# Patient Record
Sex: Female | Born: 1985 | Race: White | Hispanic: No | Smoking: Never smoker
Health system: Southern US, Community
[De-identification: ages and names within clinical notes are randomized; demographics above are authoritative.]

## PROBLEM LIST (undated history)

## (undated) DIAGNOSIS — Z3490 Encounter for supervision of normal pregnancy, unspecified, unspecified trimester: Principal | ICD-10-CM

## (undated) DIAGNOSIS — Z9089 Acquired absence of other organs: Secondary | ICD-10-CM

## (undated) HISTORY — DX: Encounter for supervision of normal pregnancy, unspecified, unspecified trimester: Z34.90

## (undated) HISTORY — PX: TONSILLECTOMY AND ADENOIDECTOMY: SHX28

---

## 2014-01-23 ENCOUNTER — Telehealth: Payer: Self-pay | Admitting: Family Medicine

## 2014-01-23 NOTE — Telephone Encounter (Signed)
This pt has not been seen since 2008, would like to come back but we are not taking  New patients at this time. She was a pt, went to school, got married moved away and is now [place] in [place].   The daughter Alicia Wheeler And Possibly husband Alicia Wheeler Would also like to join our practice.   Please advise

## 2014-01-23 NOTE — Telephone Encounter (Signed)
Will accept all, thanks

## 2014-01-24 NOTE — Telephone Encounter (Signed)
Called an advised we will take them all on

## 2014-10-30 ENCOUNTER — Ambulatory Visit (INDEPENDENT_AMBULATORY_CARE_PROVIDER_SITE_OTHER): Payer: BC Managed Care – PPO | Admitting: Adult Health

## 2014-10-30 ENCOUNTER — Encounter: Payer: Self-pay | Admitting: Adult Health

## 2014-10-30 VITALS — BP 120/84 | Ht 63.0 in | Wt 152.0 lb

## 2014-10-30 DIAGNOSIS — Z3490 Encounter for supervision of normal pregnancy, unspecified, unspecified trimester: Secondary | ICD-10-CM | POA: Insufficient documentation

## 2014-10-30 DIAGNOSIS — Z3201 Encounter for pregnancy test, result positive: Secondary | ICD-10-CM

## 2014-10-30 HISTORY — DX: Encounter for supervision of normal pregnancy, unspecified, unspecified trimester: Z34.90

## 2014-10-30 LAB — POCT URINE PREGNANCY: Preg Test, Ur: POSITIVE

## 2014-10-30 NOTE — Patient Instructions (Signed)
First Trimester of Pregnancy The first trimester of pregnancy is from week 1 until the end of week 12 (months 1 through 3). A week after a sperm fertilizes an egg, the egg will implant on the wall of the uterus. This embryo will begin to develop into a baby. Genes from you and your partner are forming the baby. The female genes determine whether the baby is a boy or a girl. At 6-8 weeks, the eyes and face are formed, and the heartbeat can be seen on ultrasound. At the end of 12 weeks, all the baby's organs are formed.  Now that you are pregnant, you will want to do everything you can to have a healthy baby. Two of the most important things are to get good prenatal care and to follow your health care provider's instructions. Prenatal care is all the medical care you receive before the baby's birth. This care will help prevent, find, and treat any problems during the pregnancy and childbirth. BODY CHANGES Your body goes through many changes during pregnancy. The changes vary from woman to woman.   You may gain or lose a couple of pounds at first.  You may feel sick to your stomach (nauseous) and throw up (vomit). If the vomiting is uncontrollable, call your health care provider.  You may tire easily.  You may develop headaches that can be relieved by medicines approved by your health care provider.  You may urinate more often. Painful urination may mean you have a bladder infection.  You may develop heartburn as a result of your pregnancy.  You may develop constipation because certain hormones are causing the muscles that push waste through your intestines to slow down.  You may develop hemorrhoids or swollen, bulging veins (varicose veins).  Your breasts may begin to grow larger and become tender. Your nipples may stick out more, and the tissue that surrounds them (areola) may become darker.  Your gums may bleed and may be sensitive to brushing and flossing.  Dark spots or blotches (chloasma,  mask of pregnancy) may develop on your face. This will likely fade after the baby is born.  Your menstrual periods will stop.  You may have a loss of appetite.  You may develop cravings for certain kinds of food.  You may have changes in your emotions from day to day, such as being excited to be pregnant or being concerned that something may go wrong with the pregnancy and baby.  You may have more vivid and strange dreams.  You may have changes in your hair. These can include thickening of your hair, rapid growth, and changes in texture. Some women also have hair loss during or after pregnancy, or hair that feels dry or thin. Your hair will most likely return to normal after your baby is born. WHAT TO EXPECT AT YOUR PRENATAL VISITS During a routine prenatal visit:  You will be weighed to make sure you and the baby are growing normally.  Your blood pressure will be taken.  Your abdomen will be measured to track your baby's growth.  The fetal heartbeat will be listened to starting around week 10 or 12 of your pregnancy.  Test results from any previous visits will be discussed. Your health care provider may ask you:  How you are feeling.  If you are feeling the baby move.  If you have had any abnormal symptoms, such as leaking fluid, bleeding, severe headaches, or abdominal cramping.  If you have any questions. Other tests   that may be performed during your first trimester include:  Blood tests to find your blood type and to check for the presence of any previous infections. They will also be used to check for low iron levels (anemia) and Rh antibodies. Later in the pregnancy, blood tests for diabetes will be done along with other tests if problems develop.  Urine tests to check for infections, diabetes, or protein in the urine.  An ultrasound to confirm the proper growth and development of the baby.  An amniocentesis to check for possible genetic problems.  Fetal screens for  spina bifida and Down syndrome.  You may need other tests to make sure you and the baby are doing well. HOME CARE INSTRUCTIONS  Medicines  Follow your health care provider's instructions regarding medicine use. Specific medicines may be either safe or unsafe to take during pregnancy.  Take your prenatal vitamins as directed.  If you develop constipation, try taking a stool softener if your health care provider approves. Diet  Eat regular, well-balanced meals. Choose a variety of foods, such as meat or vegetable-based protein, fish, milk and low-fat dairy products, vegetables, fruits, and whole grain breads and cereals. Your health care provider will help you determine the amount of weight gain that is right for you.  Avoid raw meat and uncooked cheese. These carry germs that can cause birth defects in the baby.  Eating four or five small meals rather than three large meals a day may help relieve nausea and vomiting. If you start to feel nauseous, eating a few soda crackers can be helpful. Drinking liquids between meals instead of during meals also seems to help nausea and vomiting.  If you develop constipation, eat more high-fiber foods, such as fresh vegetables or fruit and whole grains. Drink enough fluids to keep your urine clear or pale yellow. Activity and Exercise  Exercise only as directed by your health care provider. Exercising will help you:  Control your weight.  Stay in shape.  Be prepared for labor and delivery.  Experiencing pain or cramping in the lower abdomen or low back is a good sign that you should stop exercising. Check with your health care provider before continuing normal exercises.  Try to avoid standing for long periods of time. Move your legs often if you must stand in one place for a long time.  Avoid heavy lifting.  Wear low-heeled shoes, and practice good posture.  You may continue to have sex unless your health care provider directs you  otherwise. Relief of Pain or Discomfort  Wear a good support bra for breast tenderness.   Take warm sitz baths to soothe any pain or discomfort caused by hemorrhoids. Use hemorrhoid cream if your health care provider approves.   Rest with your legs elevated if you have leg cramps or low back pain.  If you develop varicose veins in your legs, wear support hose. Elevate your feet for 15 minutes, 3-4 times a day. Limit salt in your diet. Prenatal Care  Schedule your prenatal visits by the twelfth week of pregnancy. They are usually scheduled monthly at first, then more often in the last 2 months before delivery.  Write down your questions. Take them to your prenatal visits.  Keep all your prenatal visits as directed by your health care provider. Safety  Wear your seat belt at all times when driving.  Make a list of emergency phone numbers, including numbers for family, friends, the hospital, and police and fire departments. General Tips    Ask your health care provider for a referral to a local prenatal education class. Begin classes no later than at the beginning of month 6 of your pregnancy.  Ask for help if you have counseling or nutritional needs during pregnancy. Your health care provider can offer advice or refer you to specialists for help with various needs.  Do not use hot tubs, steam rooms, or saunas.  Do not douche or use tampons or scented sanitary pads.  Do not cross your legs for long periods of time.  Avoid cat litter boxes and soil used by cats. These carry germs that can cause birth defects in the baby and possibly loss of the fetus by miscarriage or stillbirth.  Avoid all smoking, herbs, alcohol, and medicines not prescribed by your health care provider. Chemicals in these affect the formation and growth of the baby.  Schedule a dentist appointment. At home, brush your teeth with a soft toothbrush and be gentle when you floss. SEEK MEDICAL CARE IF:   You have  dizziness.  You have mild pelvic cramps, pelvic pressure, or nagging pain in the abdominal area.  You have persistent nausea, vomiting, or diarrhea.  You have a bad smelling vaginal discharge.  You have pain with urination.  You notice increased swelling in your face, hands, legs, or ankles. SEEK IMMEDIATE MEDICAL CARE IF:   You have a fever.  You are leaking fluid from your vagina.  You have spotting or bleeding from your vagina.  You have severe abdominal cramping or pain.  You have rapid weight gain or loss.  You vomit blood or material that looks like coffee grounds.  You are exposed to [place] measles and have never had them.  You are exposed to fifth disease or chickenpox.  You develop a severe headache.  You have shortness of breath.  You have any kind of trauma, such as from a fall or a car accident. Document Released: 11/25/2001 Document Revised: 04/17/2014 Document Reviewed: 10/11/2013 [facility] Patient Information 2015 [place] [place]. This information is not intended to replace advice given to you by your health care provider. Make sure you discuss any questions you have with your health care provider. Return in 1 week for new OB

## 2014-10-30 NOTE — Progress Notes (Signed)
Subjective:     Patient ID: Alicia CantorAdrienne Wheeler, female   DOB: 07/28/1986, 28 y.o.   MRN: [id number]  HPI [name] is a 28 year old white female in for UPT.  Review of Systems See HPI Reviewed past medical,surgical, social and family history. Reviewed medications and allergies.     Objective:   Physical Exam ,BP 120/84 mmHg  Ht 5\' 3"  (1.6 m)  Wt 152 lb (68.947 kg)  BMI 26.93 kg/m2  LMP 09/08/2014   +UPT, declines dating US, has some nausea, declines meds, she is about 7+3 weeks by LMP with EDD 06/16/15.Medicaid form given.  Assessment:     Pregnant +UPT    Plan:    Continue prenatal vitamins Return in 1 week for New OB, does not want early US Review handout on first trimester and try for pregnancy medicaid

## 2014-11-07 ENCOUNTER — Encounter: Payer: BC Managed Care – PPO | Admitting: Advanced Practice Midwife

## 2014-11-15 ENCOUNTER — Encounter: Payer: BC Managed Care – PPO | Admitting: Advanced Practice Midwife

## 2014-11-22 ENCOUNTER — Other Ambulatory Visit (HOSPITAL_COMMUNITY)
Admission: RE | Admit: 2014-11-22 | Discharge: 2014-11-22 | Disposition: A | Discharge status: Home / Self Care | Payer: BC Managed Care – PPO | Source: Ambulatory Visit | Attending: Obstetrics and Gynecology | Admitting: Obstetrics and Gynecology

## 2014-11-22 ENCOUNTER — Ambulatory Visit (INDEPENDENT_AMBULATORY_CARE_PROVIDER_SITE_OTHER): Payer: BC Managed Care – PPO | Admitting: Advanced Practice Midwife

## 2014-11-22 ENCOUNTER — Other Ambulatory Visit: Payer: Self-pay | Admitting: Advanced Practice Midwife

## 2014-11-22 ENCOUNTER — Ambulatory Visit (INDEPENDENT_AMBULATORY_CARE_PROVIDER_SITE_OTHER): Payer: BC Managed Care – PPO

## 2014-11-22 ENCOUNTER — Encounter: Payer: Self-pay | Admitting: Advanced Practice Midwife

## 2014-11-22 VITALS — BP 120/80 | Wt 150.0 lb

## 2014-11-22 DIAGNOSIS — Z0184 Encounter for antibody response examination: Secondary | ICD-10-CM

## 2014-11-22 DIAGNOSIS — Z331 Pregnant state, incidental: Secondary | ICD-10-CM

## 2014-11-22 DIAGNOSIS — O3680X0 Pregnancy with inconclusive fetal viability, not applicable or unspecified: Secondary | ICD-10-CM

## 2014-11-22 DIAGNOSIS — Z3491 Encounter for supervision of normal pregnancy, unspecified, first trimester: Secondary | ICD-10-CM

## 2014-11-22 DIAGNOSIS — Z3481 Encounter for supervision of other normal pregnancy, first trimester: Secondary | ICD-10-CM

## 2014-11-22 DIAGNOSIS — Z0283 Encounter for blood-alcohol and blood-drug test: Secondary | ICD-10-CM

## 2014-11-22 DIAGNOSIS — Z3490 Encounter for supervision of normal pregnancy, unspecified, unspecified trimester: Secondary | ICD-10-CM

## 2014-11-22 DIAGNOSIS — Z114 Encounter for screening for human immunodeficiency virus [HIV]: Secondary | ICD-10-CM

## 2014-11-22 DIAGNOSIS — Z01419 Encounter for gynecological examination (general) (routine) without abnormal findings: Secondary | ICD-10-CM | POA: Insufficient documentation

## 2014-11-22 DIAGNOSIS — Z1389 Encounter for screening for other disorder: Secondary | ICD-10-CM

## 2014-11-22 DIAGNOSIS — Z124 Encounter for screening for malignant neoplasm of cervix: Secondary | ICD-10-CM

## 2014-11-22 DIAGNOSIS — Z113 Encounter for screening for infections with a predominantly sexual mode of transmission: Secondary | ICD-10-CM

## 2014-11-22 LAB — POCT URINALYSIS DIPSTICK
GLUCOSE UA: NEGATIVE
Ketones, UA: NEGATIVE
Nitrite, UA: NEGATIVE
Protein, UA: NEGATIVE

## 2014-11-22 LAB — CBC
HCT: 38.5 % (ref 36.0–46.0)
Hemoglobin: 13.2 g/dL (ref 12.0–15.0)
MCH: 29.9 pg (ref 26.0–34.0)
MCHC: 34.3 g/dL (ref 30.0–36.0)
MCV: 87.1 fL (ref 78.0–100.0)
MPV: 11.2 fL (ref 9.4–12.4)
PLATELETS: 195 10*3/uL (ref 150–400)
RBC: 4.42 MIL/uL (ref 3.87–5.11)
RDW: 13.2 % (ref 11.5–15.5)
WBC: 6 10*3/uL (ref 4.0–10.5)

## 2014-11-22 LAB — OB RESULTS CONSOLE GBS: GBS: POSITIVE

## 2014-11-22 NOTE — Progress Notes (Signed)
  Subjective:    Alicia Wheeler is a G2P1001 2388w5d being seen today for her first obstetrical visit.  Her obstetrical history is significant for post partum hemorrhage.  Pregnancy history fully reviewed.  Patient reports no complaints.   Filed Vitals:   11/22/14 1101  BP: 120/80  Weight: 150 lb (68.04 kg)    HISTORY: OB History  Gravida Para Term Preterm AB SAB TAB Ectopic Multiple Living  2 1 1       1     # Outcome Date GA Lbr Len/2nd Weight Sex Delivery Anes PTL Lv  2 Current           1 Term 01/05/13 6666w0d  8 lb 13 oz (3.997 kg) F Vag-Spont EPI N Y     Complications: Other Excessive Bleeding     Past Medical History  Diagnosis Date  . Pregnant 10/30/2014   Past Surgical History  Procedure Laterality Date  . Tonsillectomy and adenoidectomy     Family History  Problem Relation Age of Onset  . Kidney failure Paternal Grandfather   . Kidney failure Paternal Grandmother   . Cancer Maternal Grandmother     stomach  . Cancer Maternal Grandfather     bone  . Hyperlipidemia Father   . Hyperlipidemia Mother      Exam       Pelvic Exam:    Perineum: Normal Perineum   Vulva: Normal. Pt concerned with excess skin from previous laceration:  Will remove if has another laceration   Vagina:  normal mucosa, normal discharge, no palpable nodules   Uterus Normal, Gravid, FH:  ~10     Cervix: normal   Adnexa: Not palpable   Urinary:  urethral meatus normal    System: Breast:  normal appearance, no masses or tenderness   Skin: normal coloration and turgor, no rashes    Neurologic: oriented, normal, normal mood   Extremities: normal strength, tone, and muscle mass   HEENT PERRLA   Mouth/Teeth mucous membranes moist, normal dentition   Neck supple and no masses   Cardiovascular: regular rate and rhythm   Respiratory:  appears well, vitals normal, no respiratory distress, acyanotic   Abdomen: soft, non-tender;  FHR: 148          Assessment:    Pregnancy:  G2P1001 Patient Active Problem List   Diagnosis Date Noted  . Pregnant 10/30/2014        Plan:     Initial labs drawn. Continue prenatal vitamins  Problem list reviewed and updated  Reviewed n/v relief measures and warning s/s to report  Reviewed recommended weight gain based on pre-gravid BMI  Encouraged well-balanced diet Genetic Screening discussed Integrated Screen: declined.  Ultrasound discussed; fetal survey: requested.  Follow up in 4 weeks for OBV.  CRESENZO-DISHMAN,Jeremaine Maraj 11/22/2014

## 2014-11-22 NOTE — Progress Notes (Signed)
U/S(10+5wks)-single IUP with +FCA noted, FHR-164 bpm, CRL c/w LMP dates, cx appears closed (3.2cm), bilateral adnexa appears WNL with C.L. Noted on Rt, posterior Gr 0 placenta

## 2014-11-23 LAB — URINALYSIS, ROUTINE W REFLEX MICROSCOPIC
Bilirubin Urine: NEGATIVE
GLUCOSE, UA: NEGATIVE mg/dL
Hgb urine dipstick: NEGATIVE
Ketones, ur: NEGATIVE mg/dL
Nitrite: NEGATIVE
Protein, ur: NEGATIVE mg/dL
Specific Gravity, Urine: 1.026 (ref 1.005–1.030)
Urobilinogen, UA: 0.2 mg/dL (ref 0.0–1.0)
pH: 7 (ref 5.0–8.0)

## 2014-11-23 LAB — ABO AND RH: RH TYPE: POSITIVE

## 2014-11-23 LAB — ANTIBODY SCREEN: ANTIBODY SCREEN: NEGATIVE

## 2014-11-23 LAB — DRUG SCREEN, URINE, NO CONFIRMATION
AMPHETAMINE SCRN UR: NEGATIVE
BENZODIAZEPINES.: NEGATIVE
Barbiturate Quant, Ur: NEGATIVE
COCAINE METABOLITES: NEGATIVE
CREATININE, U: 306.3 mg/dL
METHADONE: NEGATIVE
Marijuana Metabolite: NEGATIVE
Opiate Screen, Urine: NEGATIVE
Phencyclidine (PCP): NEGATIVE
Propoxyphene: NEGATIVE

## 2014-11-23 LAB — URINALYSIS, MICROSCOPIC ONLY
CASTS: NONE SEEN
Crystals: NONE SEEN
Squamous Epithelial / LPF: NONE SEEN

## 2014-11-23 LAB — HEPATITIS B SURFACE ANTIGEN: HEP B S AG: NEGATIVE

## 2014-11-23 LAB — RUBELLA SCREEN: Rubella: 2.59 Index — ABNORMAL HIGH (ref <0.90)

## 2014-11-23 LAB — HIV ANTIBODY (ROUTINE TESTING W REFLEX): HIV 1&2 Ab, 4th Generation: NONREACTIVE

## 2014-11-23 LAB — CYTOLOGY - PAP

## 2014-11-23 LAB — GC/CHLAMYDIA PROBE AMP
CT Probe RNA: NEGATIVE
GC PROBE AMP APTIMA: NEGATIVE

## 2014-11-23 LAB — RPR

## 2014-11-23 LAB — VARICELLA ZOSTER ANTIBODY, IGG: Varicella IgG: 355.9 Index — ABNORMAL HIGH (ref <135.00)

## 2014-11-24 LAB — OXYCODONE SCREEN, UA, RFLX CONFIRM: Oxycodone Screen, Ur: NEGATIVE ng/mL

## 2014-11-24 LAB — URINE CULTURE: Colony Count: 40000

## 2014-12-15 NOTE — L&D Delivery Note (Cosign Needed)
Delivery Note Pt pushed well and at 12:41 PM a viable female was delivered via Vaginal, Spontaneous Delivery (Presentation: Left Occiput Anterior).  APGAR: 8, 8; weight 8 lb 3 oz (3714 g).  Infant dried and lifted to pt's abd. Cord clamped and cut by FOB. Hospital cord blood sample collected.  Placenta status: Intact, Spontaneous.  Cord: 3 vessels with the following complications: None.    Anesthesia: Epidural  Episiotomy: None Lacerations: None Est. Blood Loss (mL): 459  Mom to postpartum.  Baby to Couplet care / Skin to Skin.  Pt given 1mg  of cytotec PO as prophylaxis after placenta delivered due to hx of PPH.   Deverick Pruss 06/14/2015, 1:22 PM

## 2014-12-20 ENCOUNTER — Ambulatory Visit (INDEPENDENT_AMBULATORY_CARE_PROVIDER_SITE_OTHER): Payer: Medicaid Other | Admitting: Advanced Practice Midwife

## 2014-12-20 VITALS — BP 120/82 | Wt 151.0 lb

## 2014-12-20 DIAGNOSIS — Z1389 Encounter for screening for other disorder: Secondary | ICD-10-CM

## 2014-12-20 DIAGNOSIS — Z3492 Encounter for supervision of normal pregnancy, unspecified, second trimester: Secondary | ICD-10-CM

## 2014-12-20 DIAGNOSIS — Z363 Encounter for antenatal screening for malformations: Secondary | ICD-10-CM

## 2014-12-20 DIAGNOSIS — Z331 Pregnant state, incidental: Secondary | ICD-10-CM

## 2014-12-20 LAB — POCT URINALYSIS DIPSTICK
Glucose, UA: NEGATIVE
Ketones, UA: NEGATIVE
Nitrite, UA: NEGATIVE
Protein, UA: NEGATIVE
RBC UA: NEGATIVE

## 2014-12-20 NOTE — Patient Instructions (Signed)

## 2014-12-20 NOTE — Progress Notes (Signed)
G2P1001 5736w5d Estimated Date of Delivery: 06/15/15  Blood pressure 120/82, weight 151 lb (68.493 kg), last menstrual period 09/08/2014.   BP weight and urine results all reviewed and noted.  Please refer to the obstetrical flow sheet for the fundal height and fetal heart rate documentation:  Patient denies any bleeding and no rupture of membranes symptoms or regular contractions. Patient is without complaints other than LBP when she bends over All questions were answered.  Plan:  Continued routine obstetrical care, back exercise handout given  Follow up in 4 weeks for OB appointment, anatomy scan

## 2015-01-22 ENCOUNTER — Ambulatory Visit (INDEPENDENT_AMBULATORY_CARE_PROVIDER_SITE_OTHER): Payer: Medicaid Other

## 2015-01-22 ENCOUNTER — Ambulatory Visit (INDEPENDENT_AMBULATORY_CARE_PROVIDER_SITE_OTHER): Payer: Medicaid Other | Admitting: Women's Health

## 2015-01-22 VITALS — BP 102/78 | Wt 152.0 lb

## 2015-01-22 DIAGNOSIS — Z363 Encounter for antenatal screening for malformations: Secondary | ICD-10-CM

## 2015-01-22 DIAGNOSIS — Z3492 Encounter for supervision of normal pregnancy, unspecified, second trimester: Secondary | ICD-10-CM

## 2015-01-22 DIAGNOSIS — R8271 Bacteriuria: Secondary | ICD-10-CM

## 2015-01-22 DIAGNOSIS — Z1389 Encounter for screening for other disorder: Secondary | ICD-10-CM

## 2015-01-22 DIAGNOSIS — Z331 Pregnant state, incidental: Secondary | ICD-10-CM

## 2015-01-22 DIAGNOSIS — Z23 Encounter for immunization: Secondary | ICD-10-CM

## 2015-01-22 DIAGNOSIS — Z36 Encounter for antenatal screening of mother: Secondary | ICD-10-CM

## 2015-01-22 DIAGNOSIS — L292 Pruritus vulvae: Secondary | ICD-10-CM

## 2015-01-22 LAB — POCT URINALYSIS DIPSTICK
Blood, UA: NEGATIVE
GLUCOSE UA: NEGATIVE
Ketones, UA: NEGATIVE
Leukocytes, UA: NEGATIVE
Nitrite, UA: NEGATIVE

## 2015-01-22 LAB — POCT WET PREP (WET MOUNT): Clue Cells Wet Prep Whiff POC: NEGATIVE

## 2015-01-22 MED ORDER — INFLUENZA VAC SPLIT QUAD 0.5 ML IM SUSY
0.5000 mL | PREFILLED_SYRINGE | Freq: Once | INTRAMUSCULAR | Status: AC
  Administered 2015-01-22: 0.5 mL via INTRAMUSCULAR

## 2015-01-22 MED ORDER — AMPICILLIN 500 MG PO CAPS: 500.0000 mg | ORAL_CAPSULE | Freq: Four times a day (QID) | ORAL | Status: DC

## 2015-01-22 NOTE — Progress Notes (Signed)
U/S(19+wks)-single active fetus, meas c/w dates, fluid WNL, posterior Gr 0 placenta, cx appears closed (3.5cm), FHR-151 bpm, no major abnl noted, female fetus

## 2015-01-22 NOTE — Patient Instructions (Signed)
Second Trimester of Pregnancy The second trimester is from week 13 through week 28, months 4 through 6. The second trimester is often a time when you feel your best. Your body has also adjusted to being pregnant, and you begin to feel better physically. Usually, morning sickness has lessened or quit completely, you may have more energy, and you may have an increase in appetite. The second trimester is also a time when the fetus is growing rapidly. At the end of the sixth month, the fetus is about 9 inches long and weighs about 1 pounds. You will likely begin to feel the baby move (quickening) between 18 and 20 weeks of the pregnancy. BODY CHANGES Your body goes through many changes during pregnancy. The changes vary from woman to woman.   Your weight will continue to increase. You will notice your lower abdomen bulging out.  You may begin to get stretch marks on your hips, abdomen, and breasts.  You may develop headaches that can be relieved by medicines approved by your health care provider.  You may urinate more often because the fetus is pressing on your bladder.  You may develop or continue to have heartburn as a result of your pregnancy.  You may develop constipation because certain hormones are causing the muscles that push waste through your intestines to slow down.  You may develop hemorrhoids or swollen, bulging veins (varicose veins).  You may have back pain because of the weight gain and pregnancy hormones relaxing your joints between the bones in your pelvis and as a result of a shift in weight and the muscles that support your balance.  Your breasts will continue to grow and be tender.  Your gums may bleed and may be sensitive to brushing and flossing.  Dark spots or blotches (chloasma, mask of pregnancy) may develop on your face. This will likely fade after the baby is born.  A dark line from your belly button to the pubic area (linea nigra) may appear. This will likely fade  after the baby is born.  You may have changes in your hair. These can include thickening of your hair, rapid growth, and changes in texture. Some women also have hair loss during or after pregnancy, or hair that feels dry or thin. Your hair will most likely return to normal after your baby is born. WHAT TO EXPECT AT YOUR PRENATAL VISITS During a routine prenatal visit:  You will be weighed to make sure you and the fetus are growing normally.  Your blood pressure will be taken.  Your abdomen will be measured to track your baby's growth.  The fetal heartbeat will be listened to.  Any test results from the previous visit will be discussed. Your health care provider may ask you:  How you are feeling.  If you are feeling the baby move.  If you have had any abnormal symptoms, such as leaking fluid, bleeding, severe headaches, or abdominal cramping.  If you have any questions. Other tests that may be performed during your second trimester include:  Blood tests that check for:  Low iron levels (anemia).  Gestational diabetes (between 24 and 28 weeks).  Rh antibodies.  Urine tests to check for infections, diabetes, or protein in the urine.  An ultrasound to confirm the proper growth and development of the baby.  An amniocentesis to check for possible genetic problems.  Fetal screens for spina bifida and Down syndrome. HOME CARE INSTRUCTIONS   Avoid all smoking, herbs, alcohol, and unprescribed   drugs. These chemicals affect the formation and growth of the baby.  Follow your health care provider's instructions regarding medicine use. There are medicines that are either safe or unsafe to take during pregnancy.  Exercise only as directed by your health care provider. Experiencing uterine cramps is a good sign to stop exercising.  Continue to eat regular, healthy meals.  Wear a good support bra for breast tenderness.  Do not use hot tubs, steam rooms, or saunas.  Wear your  seat belt at all times when driving.  Avoid raw meat, uncooked cheese, cat litter boxes, and soil used by cats. These carry germs that can cause birth defects in the baby.  Take your prenatal vitamins.  Try taking a stool softener (if your health care provider approves) if you develop constipation. Eat more high-fiber foods, such as fresh vegetables or fruit and whole grains. Drink plenty of fluids to keep your urine clear or pale yellow.  Take warm sitz baths to soothe any pain or discomfort caused by hemorrhoids. Use hemorrhoid cream if your health care provider approves.  If you develop varicose veins, wear support hose. Elevate your feet for 15 minutes, 3-4 times a day. Limit salt in your diet.  Avoid heavy lifting, wear low heel shoes, and practice good posture.  Rest with your legs elevated if you have leg cramps or low back pain.  Visit your dentist if you have not gone yet during your pregnancy. Use a soft toothbrush to brush your teeth and be gentle when you floss.  A sexual relationship may be continued unless your health care provider directs you otherwise.  Continue to go to all your prenatal visits as directed by your health care provider. SEEK MEDICAL CARE IF:   You have dizziness.  You have mild pelvic cramps, pelvic pressure, or nagging pain in the abdominal area.  You have persistent nausea, vomiting, or diarrhea.  You have a bad smelling vaginal discharge.  You have pain with urination. SEEK IMMEDIATE MEDICAL CARE IF:   You have a fever.  You are leaking fluid from your vagina.  You have spotting or bleeding from your vagina.  You have severe abdominal cramping or pain.  You have rapid weight gain or loss.  You have shortness of breath with chest pain.  You notice sudden or extreme swelling of your face, hands, ankles, feet, or legs.  You have not felt your baby move in over an hour.  You have severe headaches that do not go away with  medicine.  You have vision changes. Document Released: 11/25/2001 Document Revised: 12/06/2013 Document Reviewed: 02/01/2013 Exit[employer] Patient Information 2015 [employer]. This information is not intended to replace advice given to you by your health care provider. Make sure you discuss any questions you have with your health care provider.  

## 2015-01-22 NOTE — Progress Notes (Signed)
Low-risk OB appointment G2P1001 4123w3d Estimated Date of Delivery: 06/15/15 BP 102/78 mmHg  Wt 152 lb (68.947 kg)  LMP 09/08/2014 (Exact Date)  BP, weight, and urine reviewed.  Refer to obstetrical flow sheet for FH & FHR.  Reports good fm.  Denies regular uc's, lof, vb, or uti s/s. Vulvar itching for few weeks, no abnormal d/c or odor. Has tried hydrocortisone cream, cold compresses are what really help.  Spec exam: cx visually closed, scant creamy white nonodorous d/c, wet prep: few wbc's, no yeast, otherwise neg Continue hydrocortisone cream and cold compresses, let us know if anything changes.  Reviewed today's normal anatomy u/s, ptl s/s, fm. Had GBS bacteruria 40,000 colonies on urine cx-discussed w/ L[facility]- will treat w/ ampicillin qid x7d now, and treat in labor.  Plan:  Continue routine obstetrical care  F/U in 4wks for OB appointment  Flu shot today

## 2015-01-22 NOTE — Addendum Note (Signed)
Addended by: Criss AlvinePULLIAM, Abeni Finchum G on: 01/22/2015 10:48 AM   Modules accepted: Orders

## 2015-02-19 ENCOUNTER — Encounter: Payer: Self-pay | Admitting: Women's Health

## 2015-02-19 ENCOUNTER — Ambulatory Visit (INDEPENDENT_AMBULATORY_CARE_PROVIDER_SITE_OTHER): Payer: Medicaid Other | Admitting: Women's Health

## 2015-02-19 VITALS — BP 104/68 | HR 68 | Wt 155.0 lb

## 2015-02-19 DIAGNOSIS — Z1389 Encounter for screening for other disorder: Secondary | ICD-10-CM

## 2015-02-19 DIAGNOSIS — Z331 Pregnant state, incidental: Secondary | ICD-10-CM

## 2015-02-19 DIAGNOSIS — Z3492 Encounter for supervision of normal pregnancy, unspecified, second trimester: Secondary | ICD-10-CM

## 2015-02-19 LAB — POCT URINALYSIS DIPSTICK
Blood, UA: NEGATIVE
Glucose, UA: NEGATIVE
KETONES UA: NEGATIVE
Nitrite, UA: NEGATIVE
PROTEIN UA: NEGATIVE

## 2015-02-19 NOTE — Progress Notes (Signed)
Low-risk OB appointment G2P1001 10617w3d Estimated Date of Delivery: 06/15/15 BP 104/68 mmHg  Pulse 68  Wt 155 lb (70.308 kg)  LMP 09/08/2014 (Exact Date)  BP, weight, and urine reviewed.  Refer to obstetrical flow sheet for FH & FHR.  Reports good fm.  Denies regular uc's, lof, vb, or uti s/s. No complaints. Reviewed ptl s/s, fm. Plan:  Continue routine obstetrical care  F/U in 4wks for OB appointment and pn2

## 2015-02-19 NOTE — Patient Instructions (Signed)
You will have your sugar test next visit.  Please do not eat or drink anything after midnight the night before you come, not even water.  You will be here for at least two hours.     Call the office ([phone]) or go to [facility] if:  You begin to have strong, frequent contractions  Your water breaks.  Sometimes it is a big gush of fluid, sometimes it is just a trickle that keeps getting your panties wet or running down your legs  You have vaginal bleeding.  It is normal to have a small amount of spotting if your cervix was checked.   You don't feel your baby moving like normal.  If you don't, get you something to eat and drink and lay down and focus on feeling your baby move.   If your baby is still not moving like normal, you should call the office or go to [facility].  Second Trimester of Pregnancy The second trimester is from week 13 through week 28, months 4 through 6. The second trimester is often a time when you feel your best. Your body has also adjusted to being pregnant, and you begin to feel better physically. Usually, morning sickness has lessened or quit completely, you may have more energy, and you may have an increase in appetite. The second trimester is also a time when the fetus is growing rapidly. At the end of the sixth month, the fetus is about 9 inches long and weighs about 1 pounds. You will likely begin to feel the baby move (quickening) between 18 and 20 weeks of the pregnancy. BODY CHANGES Your body goes through many changes during pregnancy. The changes vary from woman to woman.   Your weight will continue to increase. You will notice your lower abdomen bulging out.  You may begin to get stretch marks on your hips, abdomen, and breasts.  You may develop headaches that can be relieved by medicines approved by your health care provider.  You may urinate more often because the fetus is pressing on your bladder.  You may develop or continue to have  heartburn as a result of your pregnancy.  You may develop constipation because certain hormones are causing the muscles that push waste through your intestines to slow down.  You may develop hemorrhoids or swollen, bulging veins (varicose veins).  You may have back pain because of the weight gain and pregnancy hormones relaxing your joints between the bones in your pelvis and as a result of a shift in weight and the muscles that support your balance.  Your breasts will continue to grow and be tender.  Your gums may bleed and may be sensitive to brushing and flossing.  Dark spots or blotches (chloasma, mask of pregnancy) may develop on your face. This will likely fade after the baby is born.  A dark line from your belly button to the pubic area (linea nigra) may appear. This will likely fade after the baby is born.  You may have changes in your hair. These can include thickening of your hair, rapid growth, and changes in texture. Some women also have hair loss during or after pregnancy, or hair that feels dry or thin. Your hair will most likely return to normal after your baby is born. WHAT TO EXPECT AT YOUR PRENATAL VISITS During a routine prenatal visit:  You will be weighed to make sure you and the fetus are growing normally.  Your blood pressure will be taken.    Your abdomen will be measured to track your baby's growth.  The fetal heartbeat will be listened to.  Any test results from the previous visit will be discussed. Your health care provider may ask you:  How you are feeling.  If you are feeling the baby move.  If you have had any abnormal symptoms, such as leaking fluid, bleeding, severe headaches, or abdominal cramping.  If you have any questions. Other tests that may be performed during your second trimester include:  Blood tests that check for:  Low iron levels (anemia).  Gestational diabetes (between 24 and 28 weeks).  Rh antibodies.  Urine tests to check  for infections, diabetes, or protein in the urine.  An ultrasound to confirm the proper growth and development of the baby.  An amniocentesis to check for possible genetic problems.  Fetal screens for spina bifida and Down syndrome. HOME CARE INSTRUCTIONS   Avoid all smoking, herbs, alcohol, and unprescribed drugs. These chemicals affect the formation and growth of the baby.  Follow your health care provider's instructions regarding medicine use. There are medicines that are either safe or unsafe to take during pregnancy.  Exercise only as directed by your health care provider. Experiencing uterine cramps is a good sign to stop exercising.  Continue to eat regular, healthy meals.  Wear a good support bra for breast tenderness.  Do not use hot tubs, steam rooms, or saunas.  Wear your seat belt at all times when driving.  Avoid raw meat, uncooked cheese, cat litter boxes, and soil used by cats. These carry germs that can cause birth defects in the baby.  Take your prenatal vitamins.  Try taking a stool softener (if your health care provider approves) if you develop constipation. Eat more high-fiber foods, such as fresh vegetables or fruit and whole grains. Drink plenty of fluids to keep your urine clear or pale yellow.  Take warm sitz baths to soothe any pain or discomfort caused by hemorrhoids. Use hemorrhoid cream if your health care provider approves.  If you develop varicose veins, wear support hose. Elevate your feet for 15 minutes, 3-4 times a day. Limit salt in your diet.  Avoid heavy lifting, wear low heel shoes, and practice good posture.  Rest with your legs elevated if you have leg cramps or low back pain.  Visit your dentist if you have not gone yet during your pregnancy. Use a soft toothbrush to brush your teeth and be gentle when you floss.  A sexual relationship may be continued unless your health care provider directs you otherwise.  Continue to go to all your  prenatal visits as directed by your health care provider. SEEK MEDICAL CARE IF:   You have dizziness.  You have mild pelvic cramps, pelvic pressure, or nagging pain in the abdominal area.  You have persistent nausea, vomiting, or diarrhea.  You have a bad smelling vaginal discharge.  You have pain with urination. SEEK IMMEDIATE MEDICAL CARE IF:   You have a fever.  You are leaking fluid from your vagina.  You have spotting or bleeding from your vagina.  You have severe abdominal cramping or pain.  You have rapid weight gain or loss.  You have shortness of breath with chest pain.  You notice sudden or extreme swelling of your face, hands, ankles, feet, or legs.  You have not felt your baby move in over an hour.  You have severe headaches that do not go away with medicine.  You have vision changes.   Document Released: 11/25/2001 Document Revised: 12/06/2013 Document Reviewed: 02/01/2013 [employer] Patient Information 2015 [employer]. This information is not intended to replace advice given to you by your health care provider. Make sure you discuss any questions you have with your health care provider.     

## 2015-03-01 ENCOUNTER — Other Ambulatory Visit: Payer: Self-pay | Admitting: Women's Health

## 2015-03-19 ENCOUNTER — Other Ambulatory Visit: Payer: Medicaid Other

## 2015-03-19 ENCOUNTER — Encounter: Payer: Self-pay | Admitting: Women's Health

## 2015-03-19 ENCOUNTER — Ambulatory Visit (INDEPENDENT_AMBULATORY_CARE_PROVIDER_SITE_OTHER): Payer: Medicaid Other | Admitting: Women's Health

## 2015-03-19 VITALS — BP 102/64 | HR 76 | Wt 162.0 lb

## 2015-03-19 DIAGNOSIS — Z0184 Encounter for antibody response examination: Secondary | ICD-10-CM

## 2015-03-19 DIAGNOSIS — Z331 Pregnant state, incidental: Secondary | ICD-10-CM

## 2015-03-19 DIAGNOSIS — Z113 Encounter for screening for infections with a predominantly sexual mode of transmission: Secondary | ICD-10-CM

## 2015-03-19 DIAGNOSIS — Z114 Encounter for screening for human immunodeficiency virus [HIV]: Secondary | ICD-10-CM

## 2015-03-19 DIAGNOSIS — Z3483 Encounter for supervision of other normal pregnancy, third trimester: Secondary | ICD-10-CM

## 2015-03-19 DIAGNOSIS — Z3492 Encounter for supervision of normal pregnancy, unspecified, second trimester: Secondary | ICD-10-CM

## 2015-03-19 DIAGNOSIS — Z1389 Encounter for screening for other disorder: Secondary | ICD-10-CM

## 2015-03-19 DIAGNOSIS — Z131 Encounter for screening for diabetes mellitus: Secondary | ICD-10-CM

## 2015-03-19 LAB — POCT URINALYSIS DIPSTICK
Blood, UA: NEGATIVE
GLUCOSE UA: NEGATIVE
KETONES UA: NEGATIVE
NITRITE UA: NEGATIVE

## 2015-03-19 NOTE — Patient Instructions (Signed)

## 2015-03-19 NOTE — Progress Notes (Signed)
Low-risk OB appointment G2P1001 3741w3d Estimated Date of Delivery: 06/15/15 BP 102/64 mmHg  Pulse 76  Wt 162 lb (73.483 kg)  LMP 09/08/2014 (Exact Date)  BP, weight, and urine reviewed.  Refer to obstetrical flow sheet for FH & FHR.  Reports good fm.  Denies regular uc's, lof, vb, or uti s/s. No complaints. Reviewed ptl s/s, fkc. Recommended Tdap at HD/PCP per CDC guidelines.  Plan:  Continue routine obstetrical care  F/U in 3wks for OB appointment  PN2 today

## 2015-03-20 LAB — CBC
HCT: 36.8 % (ref 34.0–46.6)
Hemoglobin: 12.2 g/dL (ref 11.1–15.9)
MCH: 30.8 pg (ref 26.6–33.0)
MCHC: 33.2 g/dL (ref 31.5–35.7)
MCV: 93 fL (ref 79–97)
Platelets: 173 10*3/uL (ref 150–379)
RBC: 3.96 x10E6/uL (ref 3.77–5.28)
RDW: 14.1 % (ref 12.3–15.4)
WBC: 6.6 10*3/uL (ref 3.4–10.8)

## 2015-03-20 LAB — GLUCOSE TOLERANCE, 2 HOURS W/ 1HR
GLUCOSE, 2 HOUR: 113 mg/dL (ref 65–152)
GLUCOSE, FASTING: 77 mg/dL (ref 65–91)
Glucose, 1 hour: 151 mg/dL (ref 65–179)

## 2015-03-20 LAB — HSV 2 ANTIBODY, IGG: HSV 2 Glycoprotein G Ab, IgG: <0.91 index (ref 0.00–0.90)

## 2015-03-20 LAB — AB SCR+ANTIBODY ID

## 2015-03-20 LAB — HIV ANTIBODY (ROUTINE TESTING W REFLEX): HIV Screen 4th Generation wRfx: NONREACTIVE

## 2015-03-20 LAB — RPR: RPR Ser Ql: NONREACTIVE

## 2015-03-20 LAB — ANTIBODY SCREEN

## 2015-04-16 ENCOUNTER — Ambulatory Visit (INDEPENDENT_AMBULATORY_CARE_PROVIDER_SITE_OTHER): Payer: Medicaid Other | Admitting: Women's Health

## 2015-04-16 VITALS — BP 108/60 | HR 88 | Wt 166.0 lb

## 2015-04-16 DIAGNOSIS — Z331 Pregnant state, incidental: Secondary | ICD-10-CM

## 2015-04-16 DIAGNOSIS — Z1389 Encounter for screening for other disorder: Secondary | ICD-10-CM

## 2015-04-16 DIAGNOSIS — Z3493 Encounter for supervision of normal pregnancy, unspecified, third trimester: Secondary | ICD-10-CM

## 2015-04-16 LAB — POCT URINALYSIS DIPSTICK
Glucose, UA: NEGATIVE
Ketones, UA: NEGATIVE
LEUKOCYTES UA: NEGATIVE
Nitrite, UA: NEGATIVE
PROTEIN UA: NEGATIVE
RBC UA: NEGATIVE

## 2015-04-16 NOTE — Progress Notes (Signed)
Low-risk OB appointment G2P1001 4063w3d Estimated Date of Delivery: 06/15/15 BP 108/60 mmHg  Pulse 88  Wt 166 lb (75.297 kg)  LMP 09/08/2014 (Exact Date)  BP, weight, and urine reviewed.  Refer to obstetrical flow sheet for FH & FHR.  Reports good fm.  Denies regular uc's, lof, vb, or uti s/s. No complaints. Wants paragard- does not want hormones- understands possibility for heavy periods Reviewed ptl s/s, fkc, pn2 results. Plan:  Continue routine obstetrical care  F/U in 2wks for OB appointment

## 2015-04-16 NOTE — Patient Instructions (Signed)
Call the office [phone]) [facility]:  You begin to have strong, frequent contractions  Your water breaks.  Sometimes it is a big gush of fluid, sometimes it is just a trickle that keeps getting your panties wet or running down your legs  You have vaginal bleeding.  It is normal to have a small amount of spotting if your cervix was checked.   You don't feel your baby moving like normal.  If you don't, get you something to eat and drink and lay down and focus on feeling your baby move.  You should feel at least 10 movements in 2 hours.  If you don't, you should call the office or [facility]   Intrauterine Device Information An intrauterine device (IUD) is inserted into your uterus to prevent pregnancy. There are two types of IUDs available:  5. Copper IUD--This type of IUD is wrapped in copper wire and is placed inside the uterus. Copper makes the uterus and fallopian tubes produce a fluid that kills sperm. The copper IUD can stay in place for 10 years. 6. Hormone IUD--This type of IUD contains the hormone progestin (synthetic progesterone). The hormone thickens the cervical mucus and prevents sperm from entering the uterus. It also thins the uterine lining to prevent implantation of a fertilized egg. The hormone can weaken or kill the sperm that get into the uterus. One type of hormone IUD can stay in place for 5 years, and another type can stay in place for 3 years. Your health care provider will make sure you are a good candidate for a contraceptive IUD. Discuss with your health care provider the possible side effects.  ADVANTAGES OF AN INTRAUTERINE DEVICE  IUDs are highly effective, reversible, long acting, and low maintenance.   There are no estrogen-related side effects.   An IUD can be used when breastfeeding.   IUDs are not associated with weight gain.   The copper IUD works immediately after insertion.   The hormone IUD works right away if  inserted within 7 days of your period starting. You will need to use a backup method of birth control for 7 days if the hormone IUD is inserted at any other time in your cycle.  The copper IUD does not interfere with your female hormones.   The hormone IUD can make heavy menstrual periods lighter and decrease cramping.   The hormone IUD can be used for 3 or 5 years.   The copper IUD can be used for 10 years. DISADVANTAGES OF AN INTRAUTERINE DEVICE  The hormone IUD can be associated with irregular bleeding patterns.   The copper IUD can make your menstrual flow heavier and more painful.   You may experience cramping and vaginal bleeding after insertion.  Document Released: 11/04/2004 Document Revised: 08/03/2013 Document Reviewed: 05/22/2013 [facility] Patient Information 2015 [employer]. This information is not intended to replace advice given to you by your health care provider. Make sure you discuss any questions you have with your health care provider.

## 2015-04-30 ENCOUNTER — Encounter: Payer: Self-pay | Admitting: Women's Health

## 2015-04-30 ENCOUNTER — Ambulatory Visit (INDEPENDENT_AMBULATORY_CARE_PROVIDER_SITE_OTHER): Payer: Medicaid Other | Admitting: Women's Health

## 2015-04-30 VITALS — BP 116/64 | HR 72 | Wt 166.0 lb

## 2015-04-30 DIAGNOSIS — O09299 Supervision of pregnancy with other poor reproductive or obstetric history, unspecified trimester: Secondary | ICD-10-CM | POA: Insufficient documentation

## 2015-04-30 DIAGNOSIS — O09293 Supervision of pregnancy with other poor reproductive or obstetric history, third trimester: Secondary | ICD-10-CM

## 2015-04-30 DIAGNOSIS — Z331 Pregnant state, incidental: Secondary | ICD-10-CM

## 2015-04-30 DIAGNOSIS — Z1389 Encounter for screening for other disorder: Secondary | ICD-10-CM

## 2015-04-30 DIAGNOSIS — Z3493 Encounter for supervision of normal pregnancy, unspecified, third trimester: Secondary | ICD-10-CM

## 2015-04-30 DIAGNOSIS — O26843 Uterine size-date discrepancy, third trimester: Secondary | ICD-10-CM

## 2015-04-30 LAB — POCT URINALYSIS DIPSTICK
Blood, UA: NEGATIVE
Glucose, UA: NEGATIVE
Ketones, UA: NEGATIVE
NITRITE UA: NEGATIVE

## 2015-04-30 NOTE — Progress Notes (Signed)
Low-risk OB appointment G2P1001 8143w3d Estimated Date of Delivery: 06/15/15 BP 116/64 mmHg  Pulse 72  Wt 166 lb (75.297 kg)  LMP 09/08/2014 (Exact Date)  BP, weight, and urine reviewed.  Refer to obstetrical flow sheet for FH & FHR.  Reports good fm.  Denies regular uc's, lof, vb, or uti s/s. No complaints. Lots of questions about delivery. Had PPH w/ transfusion after last birth. Doesn't want phenergan b/c knocked her out during last birth.  Reviewed ptl s/s, fkc. Plan:  Continue routine obstetrical care  F/U asap for efw/afi u/s for s<d (no visit), then 2wks  for OB appointment

## 2015-04-30 NOTE — Patient Instructions (Signed)
Call the office ([phone]) or go to [facility] if:  You begin to have strong, frequent contractions  Your water breaks.  Sometimes it is a big gush of fluid, sometimes it is just a trickle that keeps getting your panties wet or running down your legs  You have vaginal bleeding.  It is normal to have a small amount of spotting if your cervix was checked.   You don't feel your baby moving like normal.  If you don't, get you something to eat and drink and lay down and focus on feeling your baby move.  You should feel at least 10 movements in 2 hours.  If you don't, you should call the office or go to [facility].    Preterm Labor Information Preterm labor is when labor starts at less than 37 weeks of pregnancy. The normal length of a pregnancy is 39 to 41 weeks. CAUSES Often, there is no identifiable underlying cause as to why a woman goes into preterm labor. One of the most common known causes of preterm labor is infection. Infections of the uterus, cervix, vagina, amniotic sac, bladder, kidney, or even the lungs (pneumonia) can cause labor to start. Other suspected causes of preterm labor include:   Urogenital infections, such as yeast infections and bacterial vaginosis.   Uterine abnormalities (uterine shape, uterine septum, fibroids, or bleeding from the placenta).   A cervix that has been operated on (it may fail to stay closed).   Malformations in the fetus.   Multiple gestations (twins, triplets, and so on).   Breakage of the amniotic sac.  RISK FACTORS  Having a previous history of preterm labor.   Having premature rupture of membranes (PROM).   Having a placenta that covers the opening of the cervix (placenta previa).   Having a placenta that separates from the uterus (placental abruption).   Having a cervix that is too weak to hold the fetus in the uterus (incompetent cervix).   Having too much fluid in the amniotic sac (polyhydramnios).   Taking  illegal drugs or smoking while pregnant.   Not gaining enough weight while pregnant.   Being younger than 18 and older than 27 years old.   Having a low socioeconomic status.   Being African American. SYMPTOMS Signs and symptoms of preterm labor include:   Menstrual-like cramps, abdominal pain, or back pain.  Uterine contractions that are regular, as frequent as six in an hour, regardless of their intensity (may be mild or painful).  Contractions that start on the top of the uterus and spread down to the lower abdomen and back.   A sense of increased pelvic pressure.   A watery or bloody mucus discharge that comes from the vagina.  TREATMENT Depending on the length of the pregnancy and other circumstances, your health care provider may suggest bed rest. If necessary, there are medicines that can be given to stop contractions and to mature the fetal lungs. If labor happens before 34 weeks of pregnancy, a prolonged hospital stay may be recommended. Treatment depends on the condition of both you and the fetus.  WHAT SHOULD YOU DO IF YOU THINK YOU ARE IN PRETERM LABOR? Call your health care provider right away. You will need to go to the hospital to get checked immediately. HOW CAN YOU PREVENT PRETERM LABOR IN FUTURE PREGNANCIES? You should:   Stop smoking if you smoke.  Maintain healthy weight gain and avoid chemicals and drugs that are not necessary.  Be watchful for   any type of infection.  Inform your health care provider if you have a known history of preterm labor. Document Released: 02/21/2004 Document Revised: 08/03/2013 Document Reviewed: 01/03/2013 Exit[employer] Patient Information 2015 [employer]. This information is not intended to replace advice given to you by your health care provider. Make sure you discuss any questions you have with your health care provider.  

## 2015-05-07 ENCOUNTER — Ambulatory Visit (INDEPENDENT_AMBULATORY_CARE_PROVIDER_SITE_OTHER): Payer: Medicaid Other

## 2015-05-07 DIAGNOSIS — O26843 Uterine size-date discrepancy, third trimester: Secondary | ICD-10-CM | POA: Diagnosis not present

## 2015-05-07 NOTE — Progress Notes (Signed)
US [redacted]W[redacted]D measurements c/w dates,efw 2492g 49.8%,cephalic,post pl gr 2,cx 3.8cm,afi sdp 5.6cm,normal ov's bilat,fht 127bpm, prominent gallbladder noted,no stones or sludge seen

## 2015-05-15 ENCOUNTER — Encounter: Payer: Self-pay | Admitting: Women's Health

## 2015-05-15 ENCOUNTER — Ambulatory Visit (INDEPENDENT_AMBULATORY_CARE_PROVIDER_SITE_OTHER): Payer: Medicaid Other | Admitting: Women's Health

## 2015-05-15 VITALS — BP 102/64 | HR 80 | Wt 167.0 lb

## 2015-05-15 DIAGNOSIS — Z331 Pregnant state, incidental: Secondary | ICD-10-CM

## 2015-05-15 DIAGNOSIS — Z1389 Encounter for screening for other disorder: Secondary | ICD-10-CM

## 2015-05-15 DIAGNOSIS — Z3493 Encounter for supervision of normal pregnancy, unspecified, third trimester: Secondary | ICD-10-CM

## 2015-05-15 LAB — POCT URINALYSIS DIPSTICK
Glucose, UA: NEGATIVE
Nitrite, UA: NEGATIVE
Protein, UA: NEGATIVE
RBC UA: NEGATIVE

## 2015-05-15 NOTE — Progress Notes (Signed)
Low-risk OB appointment G2P1001 8010w4d Estimated Date of Delivery: 06/15/15 BP 102/64 mmHg  Pulse 80  Wt 167 lb (75.751 kg)  LMP 09/08/2014 (Exact Date)  BP, weight, and urine reviewed.  Refer to obstetrical flow sheet for FH & FHR.  Reports good fm.  Denies regular uc's, lof, vb, or uti s/s. No complaints. FH still s<d, but efw/afi u/s last week normal- efw 49%, sdp 5+ Reviewed ptl s/s, fkc. Plan:  Continue routine obstetrical care  F/U in 1wk for OB appointment

## 2015-05-15 NOTE — Patient Instructions (Signed)
Call the office ([phone]) or go to [facility] if:  You begin to have strong, frequent contractions  Your water breaks.  Sometimes it is a big gush of fluid, sometimes it is just a trickle that keeps getting your panties wet or running down your legs  You have vaginal bleeding.  It is normal to have a small amount of spotting if your cervix was checked.   You don't feel your baby moving like normal.  If you don't, get you something to eat and drink and lay down and focus on feeling your baby move.  You should feel at least 10 movements in 2 hours.  If you don't, you should call the office or go to [facility].    Preterm Labor Information Preterm labor is when labor starts at less than 37 weeks of pregnancy. The normal length of a pregnancy is 39 to 41 weeks. CAUSES Often, there is no identifiable underlying cause as to why a woman goes into preterm labor. One of the most common known causes of preterm labor is infection. Infections of the uterus, cervix, vagina, amniotic sac, bladder, kidney, or even the lungs (pneumonia) can cause labor to start. Other suspected causes of preterm labor include:   Urogenital infections, such as yeast infections and bacterial vaginosis.   Uterine abnormalities (uterine shape, uterine septum, fibroids, or bleeding from the placenta).   A cervix that has been operated on (it may fail to stay closed).   Malformations in the fetus.   Multiple gestations (twins, triplets, and so on).   Breakage of the amniotic sac.  RISK FACTORS  Having a previous history of preterm labor.   Having premature rupture of membranes (PROM).   Having a placenta that covers the opening of the cervix (placenta previa).   Having a placenta that separates from the uterus (placental abruption).   Having a cervix that is too weak to hold the fetus in the uterus (incompetent cervix).   Having too much fluid in the amniotic sac (polyhydramnios).   Taking  illegal drugs or smoking while pregnant.   Not gaining enough weight while pregnant.   Being younger than 18 and older than 27 years old.   Having a low socioeconomic status.   Being African American. SYMPTOMS Signs and symptoms of preterm labor include:   Menstrual-like cramps, abdominal pain, or back pain.  Uterine contractions that are regular, as frequent as six in an hour, regardless of their intensity (may be mild or painful).  Contractions that start on the top of the uterus and spread down to the lower abdomen and back.   A sense of increased pelvic pressure.   A watery or bloody mucus discharge that comes from the vagina.  TREATMENT Depending on the length of the pregnancy and other circumstances, your health care provider may suggest bed rest. If necessary, there are medicines that can be given to stop contractions and to mature the fetal lungs. If labor happens before 34 weeks of pregnancy, a prolonged hospital stay may be recommended. Treatment depends on the condition of both you and the fetus.  WHAT SHOULD YOU DO IF YOU THINK YOU ARE IN PRETERM LABOR? Call your health care provider right away. You will need to go to the hospital to get checked immediately. HOW CAN YOU PREVENT PRETERM LABOR IN FUTURE PREGNANCIES? You should:   Stop smoking if you smoke.  Maintain healthy weight gain and avoid chemicals and drugs that are not necessary.  Be watchful for   any type of infection.  Inform your health care provider if you have a known history of preterm labor. Document Released: 02/21/2004 Document Revised: 08/03/2013 Document Reviewed: 01/03/2013 Exit[employer] Patient Information 2015 [employer]. This information is not intended to replace advice given to you by your health care provider. Make sure you discuss any questions you have with your health care provider.  

## 2015-05-22 ENCOUNTER — Encounter: Payer: Self-pay | Admitting: Women's Health

## 2015-05-22 ENCOUNTER — Ambulatory Visit (INDEPENDENT_AMBULATORY_CARE_PROVIDER_SITE_OTHER): Payer: Medicaid Other | Admitting: Women's Health

## 2015-05-22 VITALS — BP 98/68 | HR 80 | Wt 168.5 lb

## 2015-05-22 DIAGNOSIS — Z3493 Encounter for supervision of normal pregnancy, unspecified, third trimester: Secondary | ICD-10-CM

## 2015-05-22 DIAGNOSIS — Z118 Encounter for screening for other infectious and parasitic diseases: Secondary | ICD-10-CM

## 2015-05-22 DIAGNOSIS — Z331 Pregnant state, incidental: Secondary | ICD-10-CM

## 2015-05-22 DIAGNOSIS — Z1389 Encounter for screening for other disorder: Secondary | ICD-10-CM

## 2015-05-22 DIAGNOSIS — Z1159 Encounter for screening for other viral diseases: Secondary | ICD-10-CM

## 2015-05-22 LAB — POCT URINALYSIS DIPSTICK
Blood, UA: NEGATIVE
Glucose, UA: NEGATIVE
Ketones, UA: NEGATIVE
Nitrite, UA: NEGATIVE
PROTEIN UA: NEGATIVE

## 2015-05-22 LAB — OB RESULTS CONSOLE GC/CHLAMYDIA
Chlamydia: NEGATIVE
Gonorrhea: NEGATIVE

## 2015-05-22 NOTE — Patient Instructions (Signed)
Call the office ([phone]) or go to [facility] if:  You begin to have strong, frequent contractions  Your water breaks.  Sometimes it is a big gush of fluid, sometimes it is just a trickle that keeps getting your panties wet or running down your legs  You have vaginal bleeding.  It is normal to have a small amount of spotting if your cervix was checked.   You don't feel your baby moving like normal.  If you don't, get you something to eat and drink and lay down and focus on feeling your baby move.  You should feel at least 10 movements in 2 hours.  If you don't, you should call the office or go to [facility].    Braxton Hicks Contractions Contractions of the uterus can occur throughout pregnancy. Contractions are not always a sign that you are in labor.  WHAT ARE BRAXTON HICKS CONTRACTIONS?  Contractions that occur before labor are called Braxton Hicks contractions, or false labor. Toward the end of pregnancy (32-34 weeks), these contractions can develop more often and may become more forceful. This is not true labor because these contractions do not result in opening (dilatation) and thinning of the cervix. They are sometimes difficult to tell apart from true labor because these contractions can be forceful and people have different pain tolerances. You should not feel embarrassed if you go to the hospital with false labor. Sometimes, the only way to tell if you are in true labor is for your health care provider to look for changes in the cervix. If there are no prenatal problems or other health problems associated with the pregnancy, it is completely safe to be sent home with false labor and await the onset of true labor. HOW CAN YOU TELL THE DIFFERENCE BETWEEN TRUE AND FALSE LABOR? False Labor  The contractions of false labor are usually shorter and not as hard as those of true labor.   The contractions are usually irregular.   The contractions are often felt in the front of  the lower abdomen and in the groin.   The contractions may go away when you walk around or change positions while lying down.   The contractions get weaker and are shorter lasting as time goes on.   The contractions do not usually become progressively stronger, regular, and closer together as with true labor.  True Labor  Contractions in true labor last 30-70 seconds, become very regular, usually become more intense, and increase in frequency.   The contractions do not go away with walking.   The discomfort is usually felt in the top of the uterus and spreads to the lower abdomen and low back.   True labor can be determined by your health care provider with an exam. This will show that the cervix is dilating and getting thinner.  WHAT TO REMEMBER  Keep up with your usual exercises and follow other instructions given by your health care provider.   Take medicines as directed by your health care provider.   Keep your regular prenatal appointments.   Eat and drink lightly if you think you are going into labor.   If Braxton Hicks contractions are making you uncomfortable:   Change your position from lying down or resting to walking, or from walking to resting.   Sit and rest in a tub of warm water.   Drink 2-3 glasses of water. Dehydration may cause these contractions.   Do slow and deep breathing several times an hour.    WHEN SHOULD I SEEK IMMEDIATE MEDICAL CARE? Seek immediate medical care if:  Your contractions become stronger, more regular, and closer together.   You have fluid leaking or gushing from your vagina.   You have a fever.   You pass blood-tinged mucus.   You have vaginal bleeding.   You have continuous abdominal pain.   You have low back pain that you never had before.   You feel your baby's head pushing down and causing pelvic pressure.   Your baby is not moving as much as it used to.  Document Released: 12/01/2005 Document  Revised: 12/06/2013 Document Reviewed: 09/12/2013 Exit[employer] Patient Information 2015 [employer]. This information is not intended to replace advice given to you by your health care provider. Make sure you discuss any questions you have with your health care provider.  

## 2015-05-22 NOTE — Progress Notes (Signed)
Low-risk OB appointment G2P1001 8712w4d Estimated Date of Delivery: 06/15/15 BP 98/68 mmHg  Pulse 80  Wt 168 lb 8 oz (76.431 kg)  LMP 09/08/2014 (Exact Date)  BP, weight, and urine reviewed.  Refer to obstetrical flow sheet for FH & FHR.  Reports good fm.  Denies regular uc's, lof, vb, or uti s/s. No complaints. GBS: Pos in urine earlier in preg SVE per request: 3/50/-2, vtx Reviewed ptl s/s, fkc. Plan:  Continue routine obstetrical care  F/U in 1wk for OB appointment

## 2015-05-23 LAB — GC/CHLAMYDIA PROBE AMP
CHLAMYDIA, DNA PROBE: NEGATIVE
Neisseria gonorrhoeae by PCR: NEGATIVE

## 2015-05-29 ENCOUNTER — Ambulatory Visit (INDEPENDENT_AMBULATORY_CARE_PROVIDER_SITE_OTHER): Payer: Medicaid Other | Admitting: Women's Health

## 2015-05-29 ENCOUNTER — Encounter: Payer: Self-pay | Admitting: Women's Health

## 2015-05-29 VITALS — BP 108/68 | HR 68 | Wt 168.4 lb

## 2015-05-29 DIAGNOSIS — Z1389 Encounter for screening for other disorder: Secondary | ICD-10-CM

## 2015-05-29 DIAGNOSIS — Z331 Pregnant state, incidental: Secondary | ICD-10-CM

## 2015-05-29 DIAGNOSIS — Z3493 Encounter for supervision of normal pregnancy, unspecified, third trimester: Secondary | ICD-10-CM

## 2015-05-29 LAB — POCT URINALYSIS DIPSTICK
Glucose, UA: NEGATIVE
Ketones, UA: NEGATIVE
Nitrite, UA: NEGATIVE
PROTEIN UA: NEGATIVE
RBC UA: NEGATIVE

## 2015-05-29 NOTE — Progress Notes (Signed)
Low-risk OB appointment G2P1001 [redacted]w[redacted]d Estimated Date of Delivery: 06/15/15 BP 108/68 mmHg  Pulse 68  Wt 168 lb 6.4 oz (76.386 kg)  LMP 09/08/2014 (Exact Date)  BP, weight, and urine reviewed.  Refer to obstetrical flow sheet for FH & FHR.  Reports good fm.  Denies regular uc's, lof, vb, or uti s/s. No complaints. SVE per request: 3/50/-2, vtx, posterior Reviewed labor s/s, fkc. Plan:  Continue routine obstetrical care  F/U in 1wk for OB appointment

## 2015-05-29 NOTE — Patient Instructions (Signed)
Call the office ([phone]) or go to [facility] if:  You begin to have strong, frequent contractions  Your water breaks.  Sometimes it is a big gush of fluid, sometimes it is just a trickle that keeps getting your panties wet or running down your legs  You have vaginal bleeding.  It is normal to have a small amount of spotting if your cervix was checked.   You don't feel your baby moving like normal.  If you don't, get you something to eat and drink and lay down and focus on feeling your baby move.  You should feel at least 10 movements in 2 hours.  If you don't, you should call the office or go to [facility].    Braxton Hicks Contractions Contractions of the uterus can occur throughout pregnancy. Contractions are not always a sign that you are in labor.  WHAT ARE BRAXTON HICKS CONTRACTIONS?  Contractions that occur before labor are called Braxton Hicks contractions, or false labor. Toward the end of pregnancy (32-34 weeks), these contractions can develop more often and may become more forceful. This is not true labor because these contractions do not result in opening (dilatation) and thinning of the cervix. They are sometimes difficult to tell apart from true labor because these contractions can be forceful and people have different pain tolerances. You should not feel embarrassed if you go to the hospital with false labor. Sometimes, the only way to tell if you are in true labor is for your health care provider to look for changes in the cervix. If there are no prenatal problems or other health problems associated with the pregnancy, it is completely safe to be sent home with false labor and await the onset of true labor. HOW CAN YOU TELL THE DIFFERENCE BETWEEN TRUE AND FALSE LABOR? False Labor  The contractions of false labor are usually shorter and not as hard as those of true labor.   The contractions are usually irregular.   The contractions are often felt in the front of  the lower abdomen and in the groin.   The contractions may go away when you walk around or change positions while lying down.   The contractions get weaker and are shorter lasting as time goes on.   The contractions do not usually become progressively stronger, regular, and closer together as with true labor.  True Labor  Contractions in true labor last 30-70 seconds, become very regular, usually become more intense, and increase in frequency.   The contractions do not go away with walking.   The discomfort is usually felt in the top of the uterus and spreads to the lower abdomen and low back.   True labor can be determined by your health care provider with an exam. This will show that the cervix is dilating and getting thinner.  WHAT TO REMEMBER  Keep up with your usual exercises and follow other instructions given by your health care provider.   Take medicines as directed by your health care provider.   Keep your regular prenatal appointments.   Eat and drink lightly if you think you are going into labor.   If Braxton Hicks contractions are making you uncomfortable:   Change your position from lying down or resting to walking, or from walking to resting.   Sit and rest in a tub of warm water.   Drink 2-3 glasses of water. Dehydration may cause these contractions.   Do slow and deep breathing several times an hour.    WHEN SHOULD I SEEK IMMEDIATE MEDICAL CARE? Seek immediate medical care if:  Your contractions become stronger, more regular, and closer together.   You have fluid leaking or gushing from your vagina.   You have a fever.   You pass blood-tinged mucus.   You have vaginal bleeding.   You have continuous abdominal pain.   You have low back pain that you never had before.   You feel your baby's head pushing down and causing pelvic pressure.   Your baby is not moving as much as it used to.  Document Released: 12/01/2005 Document  Revised: 12/06/2013 Document Reviewed: 09/12/2013 Exit[employer] Patient Information 2015 [employer]. This information is not intended to replace advice given to you by your health care provider. Make sure you discuss any questions you have with your health care provider.  

## 2015-06-05 ENCOUNTER — Ambulatory Visit (INDEPENDENT_AMBULATORY_CARE_PROVIDER_SITE_OTHER): Payer: Medicaid Other | Admitting: Women's Health

## 2015-06-05 ENCOUNTER — Encounter: Payer: Self-pay | Admitting: Women's Health

## 2015-06-05 VITALS — BP 102/76 | HR 92 | Wt 170.0 lb

## 2015-06-05 DIAGNOSIS — Z1389 Encounter for screening for other disorder: Secondary | ICD-10-CM

## 2015-06-05 DIAGNOSIS — Z3493 Encounter for supervision of normal pregnancy, unspecified, third trimester: Secondary | ICD-10-CM

## 2015-06-05 DIAGNOSIS — Z331 Pregnant state, incidental: Secondary | ICD-10-CM

## 2015-06-05 LAB — POCT URINALYSIS DIPSTICK
Blood, UA: NEGATIVE
Glucose, UA: NEGATIVE
Ketones, UA: NEGATIVE
Nitrite, UA: NEGATIVE
Protein, UA: NEGATIVE

## 2015-06-05 NOTE — Patient Instructions (Signed)
Call the office ([phone]) or go to [facility] if:  You begin to have strong, frequent contractions  Your water breaks.  Sometimes it is a big gush of fluid, sometimes it is just a trickle that keeps getting your panties wet or running down your legs  You have vaginal bleeding.  It is normal to have a small amount of spotting if your cervix was checked.   You don't feel your baby moving like normal.  If you don't, get you something to eat and drink and lay down and focus on feeling your baby move.  You should feel at least 10 movements in 2 hours.  If you don't, you should call the office or go to [facility].    Braxton Hicks Contractions Contractions of the uterus can occur throughout pregnancy. Contractions are not always a sign that you are in labor.  WHAT ARE BRAXTON HICKS CONTRACTIONS?  Contractions that occur before labor are called Braxton Hicks contractions, or false labor. Toward the end of pregnancy (32-34 weeks), these contractions can develop more often and may become more forceful. This is not true labor because these contractions do not result in opening (dilatation) and thinning of the cervix. They are sometimes difficult to tell apart from true labor because these contractions can be forceful and people have different pain tolerances. You should not feel embarrassed if you go to the hospital with false labor. Sometimes, the only way to tell if you are in true labor is for your health care provider to look for changes in the cervix. If there are no prenatal problems or other health problems associated with the pregnancy, it is completely safe to be sent home with false labor and await the onset of true labor. HOW CAN YOU TELL THE DIFFERENCE BETWEEN TRUE AND FALSE LABOR? False Labor  The contractions of false labor are usually shorter and not as hard as those of true labor.   The contractions are usually irregular.   The contractions are often felt in the front of  the lower abdomen and in the groin.   The contractions may go away when you walk around or change positions while lying down.   The contractions get weaker and are shorter lasting as time goes on.   The contractions do not usually become progressively stronger, regular, and closer together as with true labor.  True Labor  Contractions in true labor last 30-70 seconds, become very regular, usually become more intense, and increase in frequency.   The contractions do not go away with walking.   The discomfort is usually felt in the top of the uterus and spreads to the lower abdomen and low back.   True labor can be determined by your health care provider with an exam. This will show that the cervix is dilating and getting thinner.  WHAT TO REMEMBER  Keep up with your usual exercises and follow other instructions given by your health care provider.   Take medicines as directed by your health care provider.   Keep your regular prenatal appointments.   Eat and drink lightly if you think you are going into labor.   If Braxton Hicks contractions are making you uncomfortable:   Change your position from lying down or resting to walking, or from walking to resting.   Sit and rest in a tub of warm water.   Drink 2-3 glasses of water. Dehydration may cause these contractions.   Do slow and deep breathing several times an hour.    WHEN SHOULD I SEEK IMMEDIATE MEDICAL CARE? Seek immediate medical care if:  Your contractions become stronger, more regular, and closer together.   You have fluid leaking or gushing from your vagina.   You have a fever.   You pass blood-tinged mucus.   You have vaginal bleeding.   You have continuous abdominal pain.   You have low back pain that you never had before.   You feel your baby's head pushing down and causing pelvic pressure.   Your baby is not moving as much as it used to.  Document Released: 12/01/2005 Document  Revised: 12/06/2013 Document Reviewed: 09/12/2013 Exit[employer] Patient Information 2015 [employer]. This information is not intended to replace advice given to you by your health care provider. Make sure you discuss any questions you have with your health care provider.  

## 2015-06-05 NOTE — Progress Notes (Signed)
Low-risk OB appointment G2P1001 [redacted]w[redacted]d Estimated Date of Delivery: 06/15/15 BP 102/76 mmHg  Pulse 92  Wt 170 lb (77.111 kg)  LMP 09/08/2014 (Exact Date)  BP, weight, and urine reviewed.  Refer to obstetrical flow sheet for FH & FHR.  Reports good fm.  Denies regular uc's, lof, vb, or uti s/s. No complaints. Reviewed labor s/s, fkc. Plan:  Continue routine obstetrical care  F/U in 1wk for OB appointment

## 2015-06-13 ENCOUNTER — Encounter: Payer: Self-pay | Admitting: Women's Health

## 2015-06-13 ENCOUNTER — Ambulatory Visit (INDEPENDENT_AMBULATORY_CARE_PROVIDER_SITE_OTHER): Payer: Medicaid Other | Admitting: Women's Health

## 2015-06-13 VITALS — BP 102/66 | HR 84 | Wt 171.0 lb

## 2015-06-13 DIAGNOSIS — Z3493 Encounter for supervision of normal pregnancy, unspecified, third trimester: Secondary | ICD-10-CM | POA: Diagnosis not present

## 2015-06-13 DIAGNOSIS — Z3A39 39 weeks gestation of pregnancy: Secondary | ICD-10-CM | POA: Diagnosis not present

## 2015-06-13 DIAGNOSIS — Z331 Pregnant state, incidental: Secondary | ICD-10-CM

## 2015-06-13 DIAGNOSIS — Z1389 Encounter for screening for other disorder: Secondary | ICD-10-CM

## 2015-06-13 LAB — POCT URINALYSIS DIPSTICK
Glucose, UA: NEGATIVE
KETONES UA: NEGATIVE
NITRITE UA: NEGATIVE
PROTEIN UA: NEGATIVE
RBC UA: NEGATIVE

## 2015-06-13 NOTE — Patient Instructions (Addendum)
Call the office [phone]) or go to [facility] if:  You begin to have strong, frequent contractions  Your water breaks.  Sometimes it is a big gush of fluid, sometimes it is just a trickle that keeps getting your panties wet or running down your legs  You have vaginal bleeding.  It is normal to have a small amount of spotting if your cervix was checked.   You don't feel your baby moving like normal.  If you don't, get you something to eat and drink and lay down and focus on feeling your baby move.  You should feel at least 10 movements in 2 hours.  If you don't, you should call the office or go to [facility].    Your induction is scheduled for 7/8 @ 7:00am. Go to [facility] hospital, Maternity Admissions Unit (Emergency) entrance and let them know you are there to be induced. They will send someone from Labor & Delivery to come get you.    Tips to Help You Sleep Better:   Get into a bedtime routine, try to do the same thing every night before going to bed to try to help your body wind down  Warm baths  Avoid caffeine for at least 3 hours before going to sleep   Keep your room at a slightly cooler temperature, can try running a fan  Turn off TV, lights, phone, electronics  Lots of pillows if needed to help you get comfortable  Lavender scented items can help you sleep. You can place lavender essential oil on a cotton ball and place under your pillowcase, or place in a diffuser. [employer] has a lavender scented sleep line (plug-ins, sprays, etc). Look in the pillow aisle for lavender scented pillows.   If none of the above things help, you can try 1/2 of a benadryl, unisom, or tylenol pm. Do not take this every night, only when you really need it.   Braxton Hicks Contractions Contractions of the uterus can occur throughout pregnancy. Contractions are not always a sign that you are in labor.  WHAT ARE BRAXTON HICKS CONTRACTIONS?  Contractions that occur before labor are called  Braxton Hicks contractions, or false labor. Toward the end of pregnancy (32-34 weeks), these contractions can develop more often and may become more forceful. This is not true labor because these contractions do not result in opening (dilatation) and thinning of the cervix. They are sometimes difficult to tell apart from true labor because these contractions can be forceful and people have different pain tolerances. You should not feel embarrassed if you go to the hospital with false labor. Sometimes, the only way to tell if you are in true labor is for your health care provider to look for changes in the cervix. If there are no prenatal problems or other health problems associated with the pregnancy, it is completely safe to be sent home with false labor and await the onset of true labor. HOW CAN YOU TELL THE DIFFERENCE BETWEEN TRUE AND FALSE LABOR? False Labor 13. The contractions of false labor are usually shorter and not as hard as those of true labor.  14. The contractions are usually irregular.  15. The contractions are often felt in the front of the lower abdomen and in the groin.  16. The contractions may go away when you walk around or change positions while lying down.  17. The contractions get weaker and are shorter lasting as time goes on.  18. The contractions do not usually become  progressively stronger, regular, and closer together as with true labor.  True Labor  Contractions in true labor last 30-70 seconds, become very regular, usually become more intense, and increase in frequency.   The contractions do not go away with walking.   The discomfort is usually felt in the top of the uterus and spreads to the lower abdomen and low back.   True labor can be determined by your health care provider with an exam. This will show that the cervix is dilating and getting thinner.  WHAT TO REMEMBER  Keep up with your usual exercises and follow other instructions given by your  health care provider.   Take medicines as directed by your health care provider.   Keep your regular prenatal appointments.   Eat and drink lightly if you think you are going into labor.   If Braxton Hicks contractions are making you uncomfortable:   Change your position from lying down or resting to walking, or from walking to resting.   Sit and rest in a tub of warm water.   Drink 2-3 glasses of water. Dehydration may cause these contractions.   Do slow and deep breathing several times an hour.  WHEN SHOULD I SEEK IMMEDIATE MEDICAL CARE? Seek immediate medical care if:  Your contractions become stronger, more regular, and closer together.   You have fluid leaking or gushing from your vagina.   You have a fever.   You pass blood-tinged mucus.   You have vaginal bleeding.   You have continuous abdominal pain.   You have low back pain that you never had before.   You feel your baby's head pushing down and causing pelvic pressure.   Your baby is not moving as much as it used to.  Document Released: 12/01/2005 Document Revised: 12/06/2013 Document Reviewed: 09/12/2013 [facility] Patient Information 2015 [place]. This information is not intended to replace advice given to you by your health care provider. Make sure you discuss any questions you have with your health care provider.

## 2015-06-13 NOTE — Progress Notes (Signed)
Low-risk OB appointment G2P1001 42100w5d Estimated Date of Delivery: 06/15/15 BP 102/66 mmHg  Pulse 84  Wt 171 lb (77.565 kg)  LMP 09/08/2014 (Exact Date)  BP, weight, and urine reviewed.  Refer to obstetrical flow sheet for FH & FHR.  Reports good fm.  Denies regular uc's, lof, vb, or uti s/s. No complaints. Wants membranes stripped, discussed r/b SVE: 4/60/-2, vtx, soft- membranes stripped, BBOW Reviewed labor s/s, fkc. Plan:  Continue routine obstetrical care  F/U in 1wk for OB appointment and nst IOL for PD scheduled for 7/8 @ 0700 if needed

## 2015-06-14 ENCOUNTER — Inpatient Hospital Stay (HOSPITAL_COMMUNITY): Payer: Medicaid Other | Admitting: Anesthesiology

## 2015-06-14 ENCOUNTER — Encounter (HOSPITAL_COMMUNITY): Payer: Self-pay | Admitting: *Deleted

## 2015-06-14 ENCOUNTER — Inpatient Hospital Stay (HOSPITAL_COMMUNITY)
Admission: AD | Admit: 2015-06-14 | Discharge: 2015-06-16 | DRG: 775 | Disposition: A | Discharge status: Home / Self Care | Payer: Medicaid Other | Source: Ambulatory Visit | Attending: Obstetrics & Gynecology | Admitting: Obstetrics & Gynecology

## 2015-06-14 DIAGNOSIS — IMO0001 Reserved for inherently not codable concepts without codable children: Secondary | ICD-10-CM

## 2015-06-14 DIAGNOSIS — Z3A39 39 weeks gestation of pregnancy: Secondary | ICD-10-CM | POA: Diagnosis not present

## 2015-06-14 DIAGNOSIS — O99824 Streptococcus B carrier state complicating childbirth: Secondary | ICD-10-CM | POA: Diagnosis present

## 2015-06-14 HISTORY — DX: Acquired absence of other organs: Z90.89

## 2015-06-14 LAB — CBC
HEMATOCRIT: 34.5 % — AB (ref 36.0–46.0)
HEMOGLOBIN: 12 g/dL (ref 12.0–15.0)
MCH: 31.5 pg (ref 26.0–34.0)
MCHC: 34.8 g/dL (ref 30.0–36.0)
MCV: 90.6 fL (ref 78.0–100.0)
PLATELETS: 134 10*3/uL — AB (ref 150–400)
RBC: 3.81 MIL/uL — ABNORMAL LOW (ref 3.87–5.11)
RDW: 13.6 % (ref 11.5–15.5)
WBC: 10.3 10*3/uL (ref 4.0–10.5)

## 2015-06-14 LAB — ABO/RH: ABO/RH(D): A POS

## 2015-06-14 LAB — PREPARE RBC (CROSSMATCH)

## 2015-06-14 LAB — RPR: RPR: NONREACTIVE

## 2015-06-14 MED ORDER — SODIUM CHLORIDE 0.9 % IV SOLN
2.0000 g | Freq: Once | INTRAVENOUS | Status: AC
  Administered 2015-06-14: 2 g via INTRAVENOUS
  Filled 2015-06-14: qty 2000

## 2015-06-14 MED ORDER — EPHEDRINE 5 MG/ML INJ
10.0000 mg | INTRAVENOUS | Status: DC | PRN
  Filled 2015-06-14: qty 2

## 2015-06-14 MED ORDER — DIPHENHYDRAMINE HCL 50 MG/ML IJ SOLN: 12.5000 mg | INTRAMUSCULAR | Status: DC | PRN

## 2015-06-14 MED ORDER — OXYTOCIN 40 UNITS IN LACTATED RINGERS INFUSION - SIMPLE MED
62.5000 mL/h | INTRAVENOUS | Status: DC
  Administered 2015-06-14: 62.5 mL/h via INTRAVENOUS
  Filled 2015-06-14: qty 1000

## 2015-06-14 MED ORDER — TETANUS-DIPHTH-ACELL PERTUSSIS 5-2.5-18.5 LF-MCG/0.5 IM SUSP: 0.5000 mL | Freq: Once | INTRAMUSCULAR | Status: DC

## 2015-06-14 MED ORDER — FENTANYL 2.5 MCG/ML BUPIVACAINE 1/10 % EPIDURAL INFUSION (WH - ANES)
INTRAMUSCULAR | Status: AC
  Administered 2015-06-14: 14 mL/h via EPIDURAL
  Filled 2015-06-14: qty 125

## 2015-06-14 MED ORDER — LACTATED RINGERS IV SOLN
INTRAVENOUS | Status: DC
  Administered 2015-06-14: 09:00:00 via INTRAVENOUS

## 2015-06-14 MED ORDER — WITCH HAZEL-GLYCERIN EX PADS: 1.0000 "application " | MEDICATED_PAD | CUTANEOUS | Status: DC | PRN

## 2015-06-14 MED ORDER — LACTATED RINGERS IV SOLN
500.0000 mL | INTRAVENOUS | Status: DC | PRN
  Administered 2015-06-14: 500 mL via INTRAVENOUS

## 2015-06-14 MED ORDER — OXYCODONE-ACETAMINOPHEN 5-325 MG PO TABS: 1.0000 | ORAL_TABLET | ORAL | Status: DC | PRN

## 2015-06-14 MED ORDER — PHENYLEPHRINE 40 MCG/ML (10ML) SYRINGE FOR IV PUSH (FOR BLOOD PRESSURE SUPPORT)
80.0000 ug | PREFILLED_SYRINGE | INTRAVENOUS | Status: DC | PRN
  Filled 2015-06-14: qty 2

## 2015-06-14 MED ORDER — CITRIC ACID-SODIUM CITRATE 334-500 MG/5ML PO SOLN: 30.0000 mL | ORAL | Status: DC | PRN

## 2015-06-14 MED ORDER — LIDOCAINE HCL (PF) 1 % IJ SOLN
30.0000 mL | INTRAMUSCULAR | Status: DC | PRN
  Filled 2015-06-14: qty 30

## 2015-06-14 MED ORDER — ACETAMINOPHEN 325 MG PO TABS: 650.0000 mg | ORAL_TABLET | ORAL | Status: DC | PRN

## 2015-06-14 MED ORDER — FENTANYL 2.5 MCG/ML BUPIVACAINE 1/10 % EPIDURAL INFUSION (WH - ANES): 14.0000 mL/h | INTRAMUSCULAR | Status: DC | PRN

## 2015-06-14 MED ORDER — ZOLPIDEM TARTRATE 5 MG PO TABS: 5.0000 mg | ORAL_TABLET | Freq: Every evening | ORAL | Status: DC | PRN

## 2015-06-14 MED ORDER — PHENYLEPHRINE 40 MCG/ML (10ML) SYRINGE FOR IV PUSH (FOR BLOOD PRESSURE SUPPORT)
PREFILLED_SYRINGE | INTRAVENOUS | Status: AC
  Filled 2015-06-14: qty 20

## 2015-06-14 MED ORDER — ONDANSETRON HCL 4 MG/2ML IJ SOLN
4.0000 mg | Freq: Four times a day (QID) | INTRAMUSCULAR | Status: DC | PRN
  Administered 2015-06-14: 4 mg via INTRAVENOUS
  Filled 2015-06-14: qty 2

## 2015-06-14 MED ORDER — MISOPROSTOL 200 MCG PO TABS
ORAL_TABLET | ORAL | Status: AC
  Filled 2015-06-14: qty 5

## 2015-06-14 MED ORDER — PRENATAL MULTIVITAMIN CH
1.0000 | ORAL_TABLET | Freq: Every day | ORAL | Status: DC
  Administered 2015-06-15 – 2015-06-16 (×2): 1 via ORAL
  Filled 2015-06-14 (×2): qty 1

## 2015-06-14 MED ORDER — IBUPROFEN 600 MG PO TABS
600.0000 mg | ORAL_TABLET | Freq: Four times a day (QID) | ORAL | Status: DC
  Administered 2015-06-14 – 2015-06-16 (×8): 600 mg via ORAL
  Filled 2015-06-14 (×8): qty 1

## 2015-06-14 MED ORDER — SODIUM CHLORIDE 0.9 % IV SOLN
2.0000 g | Freq: Four times a day (QID) | INTRAVENOUS | Status: DC
  Administered 2015-06-14: 2 g via INTRAVENOUS
  Filled 2015-06-14 (×3): qty 2000

## 2015-06-14 MED ORDER — METHYLERGONOVINE MALEATE 0.2 MG/ML IJ SOLN
INTRAMUSCULAR | Status: AC
  Filled 2015-06-14: qty 1

## 2015-06-14 MED ORDER — OXYCODONE-ACETAMINOPHEN 5-325 MG PO TABS: 2.0000 | ORAL_TABLET | ORAL | Status: DC | PRN

## 2015-06-14 MED ORDER — DIBUCAINE 1 % RE OINT: 1.0000 "application " | TOPICAL_OINTMENT | RECTAL | Status: DC | PRN

## 2015-06-14 MED ORDER — OXYTOCIN BOLUS FROM INFUSION: 500.0000 mL | INTRAVENOUS | Status: DC

## 2015-06-14 MED ORDER — SENNOSIDES-DOCUSATE SODIUM 8.6-50 MG PO TABS
2.0000 | ORAL_TABLET | ORAL | Status: DC
  Administered 2015-06-14 – 2015-06-15 (×2): 2 via ORAL
  Filled 2015-06-14 (×2): qty 2

## 2015-06-14 MED ORDER — SODIUM CHLORIDE 0.9 % IV SOLN: Freq: Once | INTRAVENOUS | Status: DC

## 2015-06-14 MED ORDER — MISOPROSTOL 200 MCG PO TABS: 1000.0000 ug | ORAL_TABLET | Freq: Once | ORAL | Status: DC

## 2015-06-14 MED ORDER — OXYCODONE-ACETAMINOPHEN 5-325 MG PO TABS
1.0000 | ORAL_TABLET | ORAL | Status: DC | PRN
  Administered 2015-06-14 – 2015-06-15 (×2): 1 via ORAL
  Filled 2015-06-14 (×2): qty 1

## 2015-06-14 MED ORDER — ONDANSETRON HCL 4 MG PO TABS: 4.0000 mg | ORAL_TABLET | ORAL | Status: DC | PRN

## 2015-06-14 MED ORDER — LIDOCAINE HCL (PF) 1 % IJ SOLN
INTRAMUSCULAR | Status: DC | PRN
  Administered 2015-06-14: 3 mL
  Administered 2015-06-14 (×2): 5 mL

## 2015-06-14 MED ORDER — BENZOCAINE-MENTHOL 20-0.5 % EX AERO: 1.0000 "application " | INHALATION_SPRAY | CUTANEOUS | Status: DC | PRN

## 2015-06-14 MED ORDER — FENTANYL CITRATE (PF) 100 MCG/2ML IJ SOLN
100.0000 ug | INTRAMUSCULAR | Status: DC | PRN
  Administered 2015-06-14: 100 ug via INTRAVENOUS
  Filled 2015-06-14: qty 2

## 2015-06-14 MED ORDER — DIPHENHYDRAMINE HCL 25 MG PO CAPS: 25.0000 mg | ORAL_CAPSULE | Freq: Four times a day (QID) | ORAL | Status: DC | PRN

## 2015-06-14 MED ORDER — LANOLIN HYDROUS EX OINT: TOPICAL_OINTMENT | CUTANEOUS | Status: DC | PRN

## 2015-06-14 MED ORDER — ONDANSETRON HCL 4 MG/2ML IJ SOLN: 4.0000 mg | INTRAMUSCULAR | Status: DC | PRN

## 2015-06-14 MED ORDER — SIMETHICONE 80 MG PO CHEW: 80.0000 mg | CHEWABLE_TABLET | ORAL | Status: DC | PRN

## 2015-06-14 NOTE — Progress Notes (Signed)
Alicia Wheeler is a 29 y.o. G2P1001 at 3364w6d  admitted for active labor  Subjective: Comfortable w/ epidural  Objective: BP 115/74 mmHg  Pulse 98  Temp(Src) 98.1 F (36.7 C) (Oral)  Resp 18  Ht 5\' 3"  (1.6 m)  Wt 77.565 kg (171 lb)  BMI 30.30 kg/m2  SpO2 99%  LMP 09/08/2014 (Exact Date)      FHT:  FHR: 140s bpm, variability: moderate,  accelerations:  Present,  decelerations:  Absent UC:   regular, every 2-4 minutes, spontaneous SVE:   Dilation: 7 Effacement (%): 100 Station: -1 Exam by:: Kim Chozen Latulippe-CNM AROM- pink-tinged fluid  Labs: Lab Results  Component Value Date   WBC 10.3 06/14/2015   HGB 12.0 06/14/2015   HCT 34.5* 06/14/2015   MCV 90.6 06/14/2015   PLT 134* 06/14/2015    Assessment / Plan: IUP@term  Active labor GBS pos  Hopeful that AROM will increase labor Anticipate SVD  Eugen Jeansonne CNM 06/14/2015, 9:29 AM

## 2015-06-14 NOTE — H&P (Signed)
LABOR ADMISSION HISTORY AND PHYSICAL  Alicia Wheeler is a 29 y.o. female G2P1001 with IUP at 7358w6d by LMP presenting for SOL. She reports +FM, + contractions, No LOF, no VB.  She plans on breast feeding. She request IUD for birth control.  Dating: By LMP --->  Estimated Date of Delivery: 06/15/15  Sono:   @[redacted]w[redacted]d , CWD, normal anatomy, cephalic presentation, 2492g, 16.1%49.8% EFW  Prenatal History/Complications: -H/o PPH w/ [facility]  FOB Richared Loeffler  Dating By LMP (refused dating us)  Pap 11/22/14: neg  GC/CT Initial: -/- 36+wks: -/-  Genetic Screen NT/IT: declined  CF screen declined  Anatomic US Normal female 'Silas'  Flu vaccine 01/22/15  Tdap Recommended ~ 28wks  Glucose Screen  2 hr 77/151/113  GBS +urine  Feed Preference breast  Contraception Paragard IUD (does not want hormones)-ordered 5/2  Circumcision Yes, at FT  Childbirth Classes declined  Pediatrician [place]      Past Medical History: Past Medical History  Diagnosis Date  . Pregnant 10/30/2014    Past Surgical History: Past Surgical History  Procedure Laterality Date  . Tonsillectomy and adenoidectomy      Obstetrical History: OB History    Gravida Para Term Preterm AB TAB SAB Ectopic Multiple Living   2 1 1       1       Social History: History   Social History  . Marital Status: Single    Spouse Name: N/A  . Number of Children: N/A  . Years of Education: N/A   Social History Main Topics  . Smoking status: Never Smoker   . Smokeless tobacco: Never Used  . Alcohol Use: No  . Drug Use: No  . Sexual Activity: Not Currently    Birth Control/ Protection: None   Other Topics Concern  . Not on file   Social History Narrative    Family History: Family History  Problem Relation Age of Onset  . Kidney failure Paternal Grandfather   . Kidney failure Paternal Grandmother   . Cancer Maternal Grandmother     stomach  . Cancer Maternal Grandfather     bone   . Hyperlipidemia Father   . Hyperlipidemia Mother     Allergies: No Known Allergies  Prescriptions prior to admission  Medication Sig Dispense Refill Last Dose  . ampicillin (PRINCIPEN) 500 MG capsule Take 1 capsule (500 mg total) by mouth 4 (four) times daily. X 7 days (Patient not taking: Reported on 02/19/2015) 28 capsule 0 Not Taking  . Prenatal Vit-Fe Fumarate-FA (PRENATAL PO) Take by mouth daily.   Taking    Review of Systems  All systems reviewed and negative except as stated in HPI  BP 106/78 mmHg  Pulse 85  Temp(Src) 97.5 F (36.4 C) (Oral)  Resp 20  LMP 09/08/2014 (Exact Date) General appearance: alert, cooperative and no distress Lungs: normal work of breathing Heart: regular rate, intact pulses Abdomen: soft, gravid, non-tender Extremities: Homans sign is negative, no sign of DVT, edema Presentation: cephalic Fetal monitoringBaseline: 125 bpm, Variability: Good {> 6 bpm), Accelerations: Reactive and Decelerations: Absent Uterine activityFrequency: Every 1-3 minutes Dilation: 6.5 Effacement (%): 90 Station: -1 Exam by:: Duncan DullWeston,RN   Prenatal labs: ABO, Rh: A/POS/-- (12/09 1149) Antibody: Comment, See Final Results (04/04 0908) Rubella:  Immune RPR: Non Reactive (04/04 0908)  HBsAg: NEGATIVE (12/09 1149)  HIV: NONREACTIVE (12/09 1149)  GBS:   positive 2 hr Glucola 77/151/113 Genetic screening  declined Anatomy US normal  Prenatal Transfer Tool  Maternal Diabetes: No Genetic Screening: Declined Maternal Ultrasounds/Referrals: Normal Fetal Ultrasounds or other Referrals:  None Maternal Substance Abuse:  No Significant Maternal Medications:  None Significant Maternal Lab Results: Lab values include: Group B Strep positive  Results for orders placed or performed in visit on 06/13/15 (from the past 24 hour(s))  POCT urinalysis dipstick   Collection Time: 06/13/15  2:38 PM  Result Value Ref Range   Color, UA     Clarity, UA     Glucose, UA neg     Bilirubin, UA     Ketones, UA neg    Spec Grav, UA     Blood, UA neg    pH, UA     Protein, UA neg    Urobilinogen, UA     Nitrite, UA neg    Leukocytes, UA moderate (2+) (A) Negative    Patient Active Problem List   Diagnosis Date Noted  . H/O postpartum hemorrhage, currently pregnant 04/30/2015  . GBS bacteriuria 01/22/2015  . Supervision of normal pregnancy 10/30/2014    Assessment: Alicia Wheeler is a 29 y.o. G2P1001 at [redacted]w[redacted]d here for SOL.   #Labor: Active labor. Expectant management. #Pain: Plan for epidural #FWB: Cat 1 #ID: GBS + -> Ampicillin ordered #MOF: breast #MOC: ParaGard IUD #Circ: Outpatient  Caryl Ada, DO 06/14/2015, 4:34 AM PGY-1, [facility]  CNM attestation:  I have seen and examined this patient; I agree with above documentation in the resident's note.   Alicia Wheeler is a 29 y.o. G2P1001 here for SOL  PE: BP 104/55 mmHg  Pulse 70  Temp(Src) 99.6 F (37.6 C) (Oral)  Resp 18  Ht  (1.6 m)  Wt 77.565 kg (171 lb)  BMI 30.30 kg/m2  SpO2 99%  LMP 09/08/2014 (Exact Date) Gen: calm comfortable, NAD Resp: normal effort, no distress Abd: gravid  ROS, labs, PMH reviewed  Plan: Admit to [facility] Expectant management Anticipate SVD  Cam Hai CNM 06/14/2015, 2:39 PM

## 2015-06-14 NOTE — Progress Notes (Signed)
Delivery of live viable female by K. Clelia CroftShaw, CNM APGARS [phone]

## 2015-06-14 NOTE — Plan of Care (Signed)
Problem: Consults Goal: Birthing Suites Patient Information Press F2 to bring up selections list Outcome: Completed/Met Date Met:  06/14/15  Pt 37-[redacted] weeks EGA

## 2015-06-14 NOTE — MAU Note (Signed)
Worsening contractions since being seen in office today.

## 2015-06-14 NOTE — Anesthesia Procedure Notes (Signed)
Epidural Patient location during procedure: OB  Staffing Anesthesiologist: Rimsha Trembley Performed by: anesthesiologist   Preanesthetic Checklist Completed: patient identified, site marked, surgical consent, pre-op evaluation, timeout performed, IV checked, risks and benefits discussed and monitors and equipment checked  Epidural Patient position: sitting Prep: DuraPrep Patient monitoring: heart rate, continuous pulse ox and blood pressure Approach: midline Location: L3-L4 Injection technique: LOR saline  Needle:  Needle type: Tuohy  Needle gauge: 17 G Needle length: 9 cm and 9 Needle insertion depth: 5 cm Catheter type: closed end flexible Catheter size: 20 Guage Catheter at skin depth: 10 cm Test dose: negative  Assessment Events: blood not aspirated, injection not painful, no injection resistance, negative IV test and no paresthesia  Additional Notes Patient identified. Risks/Benefits/Options discussed with patient including but not limited to bleeding, infection, nerve damage, paralysis, failed block, incomplete pain control, headache, blood pressure changes, nausea, vomiting, reactions to medication both or allergic, itching and postpartum back pain. Confirmed with bedside nurse the patient's most recent platelet count. Confirmed with patient that they are not currently taking any anticoagulation, have any bleeding history or any family history of bleeding disorders. Patient expressed understanding and wished to proceed. All questions were answered. Sterile technique was used throughout the entire procedure. Please see nursing notes for vital signs. Test dose was given through epidural needle and negative prior to continuing to dose epidural or start infusion. Warning signs of high block given to the patient including shortness of breath, tingling/numbness in hands, complete motor block, or any concerning symptoms with instructions to call for help. Patient was given  instructions on fall risk and not to get out of bed. All questions and concerns addressed with instructions to call with any issues.   

## 2015-06-14 NOTE — Anesthesia Preprocedure Evaluation (Signed)
Anesthesia Evaluation  Patient identified by MRN, date of birth, ID band Patient awake    Reviewed: Allergy & Precautions, H&P , NPO status , Patient's Chart, lab work & pertinent test results  History of Anesthesia Complications Negative for: history of anesthetic complications  Airway Mallampati: II  TM Distance: >3 FB Neck ROM: full    Dental no notable dental hx. (+) Teeth Intact   Pulmonary neg pulmonary ROS,  breath sounds clear to auscultation  Pulmonary exam normal       Cardiovascular negative cardio ROS Normal cardiovascular examRhythm:regular Rate:Normal     Neuro/Psych negative neurological ROS  negative psych ROS   GI/Hepatic negative GI ROS, Neg liver ROS,   Endo/Other  negative endocrine ROS  Renal/GU negative Renal ROS  negative genitourinary   Musculoskeletal   Abdominal   Peds  Hematology negative hematology ROS (+)   Anesthesia Other Findings   Reproductive/Obstetrics (+) Pregnancy                             Anesthesia Physical Anesthesia Plan  ASA: II  Anesthesia Plan: Epidural   Post-op Pain Management:    Induction:   Airway Management Planned:   Additional Equipment:   Intra-op Plan:   Post-operative Plan:   Informed Consent: I have reviewed the patients History and Physical, chart, labs and discussed the procedure including the risks, benefits and alternatives for the proposed anesthesia with the patient or authorized representative who has indicated his/her understanding and acceptance.     Plan Discussed with:   Anesthesia Plan Comments:         Anesthesia Quick Evaluation  

## 2015-06-15 NOTE — Progress Notes (Signed)
Post Partum Day 1 Subjective: no complaints, up ad lib, voiding and tolerating PO, small lochia, plans to breastfeed, IUD  Objective: Blood pressure 112/69, pulse 56, temperature 98.6 F (37 C), temperature source Oral, resp. rate 18, height 5\' 3"  (1.6 m), weight 77.565 kg (171 lb), last menstrual period 09/08/2014, SpO2 99 %, unknown if currently breastfeeding.  Physical Exam:  General: alert, cooperative and no distress Lochia:normal flow Chest: CTAB Heart: RRR no m/r/g Abdomen: +BS, soft, nontender,  Uterine Fundus: firm DVT Evaluation: No evidence of DVT seen on physical exam. Extremities: no edema   Recent Labs  06/14/15 0435  HGB 12.0  HCT 34.5*    Assessment/Plan: Plan for discharge tomorrow, Breastfeeding and Lactation consult   LOS: 1 day   CRESENZO-DISHMAN,Chrishaun Sasso 06/15/2015, 7:22 AM

## 2015-06-15 NOTE — Anesthesia Postprocedure Evaluation (Signed)
  Anesthesia Post-op Note  Patient: Alicia CantorAdrienne Wheeler  Procedure(s) Performed: * No procedures listed *  Patient Location: Mother/Baby  Anesthesia Type:Epidural  Level of Consciousness: awake and alert   Airway and Oxygen Therapy: Patient Spontanous Breathing  Post-op Pain: mild  Post-op Assessment: Post-op Vital signs reviewed, Patient's Cardiovascular Status Stable, Respiratory Function Stable, No signs of Nausea or vomiting, Pain level controlled, No headache, Spinal receding and Patient able to bend at knees              Post-op Vital Signs: Reviewed  Last Vitals:  Filed Vitals:   06/15/15 0330  BP: 112/69  Pulse: 56  Temp: 37 C  Resp: 18    Complications: No apparent anesthesia complications

## 2015-06-16 LAB — TYPE AND SCREEN
ABO/RH(D): A POS
Antibody Screen: NEGATIVE
Unit division: 0
Unit division: 0

## 2015-06-16 MED ORDER — IBUPROFEN 600 MG PO TABS: 600.0000 mg | ORAL_TABLET | Freq: Four times a day (QID) | ORAL | Status: DC

## 2015-06-16 NOTE — Discharge Summary (Signed)
Obstetric Discharge Summary Reason for Admission: onset of labor Prenatal Procedures: none Intrapartum Procedures: spontaneous vaginal delivery Postpartum Procedures: none Complications-Operative and Postpartum: none HEMOGLOBIN  Date Value Ref Range Status  06/14/2015 12.0 12.0 - 15.0 g/dL Final   HCT  Date Value Ref Range Status  06/14/2015 34.5* 36.0 - 46.0 % Final   Hospital Course: Alicia Wheeler is a 29 y.o. [id number] who presented at 7257w6d for SOL.  On 6/30 Pt pushed well and at 12:41 PM a viable female was delivered via Vaginal, Spontaneous Delivery. Post-partum course uncomplicated.  Desires IUD for contraception.  Physical Exam:  General: alert, cooperative, appears stated age and no distress Lochia: appropriate Uterine Fundus: firm Incision: n/a - vaginal delivery DVT Evaluation: No evidence of DVT seen on physical exam. Negative Homan's sign. No cords or calf tenderness. No significant calf/ankle edema.  Discharge Diagnoses: Term Pregnancy-delivered  Discharge Information: Date: 06/16/2015 Activity: pelvic rest Diet: routine Medications: PNV and Ibuprofen Condition: stable Instructions: refer to practice specific booklet Discharge to: home   Newborn Data: Live born female  Birth Weight: 8 lb 3 oz (3714 g) APGAR: 8, 8  Home with mother.  Erasmo DownerAngela M Bacigalupo, MD, MPH PGY-2,  [facility] 06/16/2015 7:34 AM

## 2015-06-16 NOTE — Lactation Note (Signed)
This note was copied from the chart of Alicia Wheeler [place]. Lactation Consultation Note; Experienced BF mom reports she feels like her milk is beginning to come in and he is more satisfied Was cluster feeding a lot yesterday and up until 3 am. Mom has baby latched to breast when I went into room. Assisted with pillows for comfort- reports he has been feeding on and off for a while. No questions at present. Reviewed BFSG and OP appointments as resources for support after DC. To call prn  Patient Name: Alicia Wheeler Fidler ZOXWR'UToday's Date: 06/16/2015 Reason for consult: Follow-up assessment   Maternal Data Formula Feeding for Exclusion: No Has patient been taught Hand Expression?: Yes Does the patient have breastfeeding experience prior to this delivery?: Yes  Feeding Feeding Type: Breast Fed Length of feed: 10 min  LATCH Score/Interventions Latch: Grasps breast easily, tongue down, lips flanged, rhythmical sucking.  Audible Swallowing: A few with stimulation  Type of Nipple: Everted at rest and after stimulation  Comfort (Breast/Nipple): Filling, red/small blisters or bruises, mild/mod discomfort  Problem noted: Mild/Moderate discomfort Interventions (Mild/moderate discomfort): Comfort gels;Hand expression  Hold (Positioning): Assistance needed to correctly position infant at breast and maintain latch. Intervention(s): Breastfeeding basics reviewed;Support Pillows  LATCH Score: 7  Lactation Tools Discussed/Used     Consult Status Consult Status: Complete    Pamelia HoitWeeks, Kaydee Magel D 06/16/2015, 8:53 AM

## 2015-06-16 NOTE — Discharge Instructions (Signed)

## 2015-06-18 NOTE — Progress Notes (Signed)
Post discharge chart review completed.  

## 2015-06-19 ENCOUNTER — Telehealth (HOSPITAL_COMMUNITY): Payer: Self-pay | Admitting: *Deleted

## 2015-06-19 NOTE — Telephone Encounter (Signed)
Preadmission screen  

## 2015-06-20 ENCOUNTER — Other Ambulatory Visit: Payer: Medicaid Other | Admitting: Obstetrics & Gynecology

## 2015-06-22 ENCOUNTER — Inpatient Hospital Stay (HOSPITAL_COMMUNITY): Admission: RE | Admit: 2015-06-22 | Payer: Medicaid Other | Source: Ambulatory Visit

## 2015-07-18 ENCOUNTER — Ambulatory Visit: Payer: Medicaid Other | Admitting: Advanced Practice Midwife

## 2015-07-23 ENCOUNTER — Ambulatory Visit: Payer: Medicaid Other | Admitting: Adult Health

## 2015-07-23 ENCOUNTER — Encounter: Payer: Self-pay | Admitting: Adult Health

## 2015-08-07 ENCOUNTER — Encounter: Payer: Self-pay | Admitting: Adult Health

## 2015-08-07 ENCOUNTER — Ambulatory Visit (INDEPENDENT_AMBULATORY_CARE_PROVIDER_SITE_OTHER): Payer: Medicaid Other | Admitting: Adult Health

## 2015-08-07 NOTE — Patient Instructions (Signed)
Physical in 1 year 

## 2015-08-07 NOTE — Progress Notes (Signed)
Patient ID: Alicia Wheeler, female   DOB: [birth year], 29 y.o.   MRN: [id number] Marsa is a 29 year old white female, married in for a postpartum visit.She had no tears and feels good.  Delivery Date: 06/14/15  Method of Delivery: Vaginal delivery baby boy, Silas 8 lbs 3 oz  Sexual Activity since delivery: Yes, no problems, using withdrawal for now  Method of Feeding: breastfeeding  Number of weeks bleeding post delivery: 4-5 weeks  Review of Systems: Patient denies any headaches, hearing loss, fatigue, blurred vision, shortness of breath, chest pain, abdominal pain, problems with bowel movements, urination, or intercourse. No joint pain or mood swings. Reviewed past medical,surgical, social and family history. Reviewed medications and allergies.   Depression Score: 1 BP 122/78 mmHg  Pulse 68  Ht  (1.6 m)  Wt 154 lb (69.854 kg)  BMI 27.29 kg/m2  Breastfeeding? Yes Pelvic Exam:   External genitalia is normal in appearance, no lesions.  The vagina has good color, moisture and rugae, no lesions.Urethra has no masses or tenderness noted. The cervix is bulbous.  Uterus is felt to be normal size, shape, and contour, well involuted.  No adnexal masses or tenderness noted.Bladder is non tender, no masses felt. She is breastfeeding and doing well but does not feel full, pump in between feeding, eat clean, fruit, veggies and lean meats and walk as she wants to lose 17 lbs.She was going to get ParaGard IUD but husband is going to get vasectomy, they have used withdrawal for 8.5 years and will continue.  Impression:  Status post delivery, post partum check, depression screening, contraceptive management.   Plan:   To use withdrawal til husband gets vasectomy Increasing pumping  Physical in 1 year

## 2018-04-12 ENCOUNTER — Ambulatory Visit: Payer: Self-pay | Admitting: Family Medicine

## 2018-04-12 ENCOUNTER — Encounter: Payer: Self-pay | Admitting: Family Medicine

## 2018-04-12 VITALS — BP 126/90 | HR 90 | Temp 99.0°F | Ht 63.0 in | Wt 167.0 lb

## 2018-04-12 DIAGNOSIS — J189 Pneumonia, unspecified organism: Secondary | ICD-10-CM

## 2018-04-12 DIAGNOSIS — J181 Lobar pneumonia, unspecified organism: Secondary | ICD-10-CM

## 2018-04-12 MED ORDER — CEFTRIAXONE SODIUM 500 MG IJ SOLR
500.0000 mg | Freq: Once | INTRAMUSCULAR | Status: AC
  Administered 2018-04-12: 500 mg via INTRAMUSCULAR

## 2018-04-12 MED ORDER — AZITHROMYCIN 250 MG PO TABS: ORAL_TABLET | ORAL | 0 refills | Status: DC

## 2018-04-12 NOTE — Progress Notes (Signed)
   Subjective:    Patient ID: Alicia Wheeler, female    DOB: May 03, 1986, 32 y.o.   MRN: [id number]  HPI Patient is here today with complaints of a productive cough (green in color), wheezing,trouble breathing,losing voice,headache,fever for three weeks. Has been taking Clairitin D and Advil ,Allegra. Patient with 3 weeks of head congestion drainage coughing allergy symptoms been going onRecently she has been having problems with increased coughing congestion body aches fatigue fever started up earlier today PMH benign does not smoke Review of Systems  Constitutional: Negative for activity change and fever.  HENT: Positive for congestion and rhinorrhea. Negative for ear pain.   Eyes: Negative for discharge.  Respiratory: Positive for cough. Negative for shortness of breath and wheezing.   Cardiovascular: Negative for chest pain.       Objective:   Physical Exam  Constitutional: She appears well-developed.  HENT:  Head: Normocephalic.  Nose: Nose normal.  Mouth/Throat: Oropharynx is clear and moist. No oropharyngeal exudate.  Neck: Neck supple.  Cardiovascular: Normal rate and normal heart sounds.  No murmur heard. Pulmonary/Chest: Effort normal and breath sounds normal. She has no wheezes.  Lymphadenopathy:    She has no cervical adenopathy.  Skin: Skin is warm and dry.  Nursing note and vitals reviewed.  We went over warning signs told her to follow-up if progressive troubles currently I do not recommend x-rays or lab work certainly if she gets worse we will need to do this       Assessment & Plan:  Early right lower lobe pneumonia Shot of Rocephin Z-Pak Follow-up if progressive troubles Warning signs were discussed Her brother came by to pick her up to take her home

## 2018-06-14 ENCOUNTER — Emergency Department (HOSPITAL_COMMUNITY): Payer: Self-pay

## 2018-06-14 ENCOUNTER — Encounter (HOSPITAL_COMMUNITY): Payer: Self-pay | Admitting: Emergency Medicine

## 2018-06-14 ENCOUNTER — Emergency Department (HOSPITAL_COMMUNITY)
Admission: EM | Admit: 2018-06-14 | Discharge: 2018-06-14 | Disposition: A | Discharge status: Home / Self Care | Payer: Self-pay | Attending: Emergency Medicine | Admitting: Emergency Medicine

## 2018-06-14 ENCOUNTER — Other Ambulatory Visit: Payer: Self-pay

## 2018-06-14 DIAGNOSIS — S42021A Displaced fracture of shaft of right clavicle, initial encounter for closed fracture: Secondary | ICD-10-CM | POA: Insufficient documentation

## 2018-06-14 DIAGNOSIS — Y929 Unspecified place or not applicable: Secondary | ICD-10-CM | POA: Insufficient documentation

## 2018-06-14 DIAGNOSIS — Y9352 Activity, horseback riding: Secondary | ICD-10-CM | POA: Insufficient documentation

## 2018-06-14 DIAGNOSIS — Y998 Other external cause status: Secondary | ICD-10-CM | POA: Insufficient documentation

## 2018-06-14 MED ORDER — HYDROCODONE-ACETAMINOPHEN 5-325 MG PO TABS: ORAL_TABLET | ORAL | 0 refills | Status: DC

## 2018-06-14 MED ORDER — HYDROCODONE-ACETAMINOPHEN 5-325 MG PO TABS
1.0000 | ORAL_TABLET | Freq: Once | ORAL | Status: AC
  Administered 2018-06-14: 1 via ORAL
  Filled 2018-06-14: qty 1

## 2018-06-14 NOTE — Discharge Instructions (Addendum)
Apply ice packs on and off to your shoulder.  Keep your arm in the sling.  Call Dr. Mort SawyersHarrison's office to arrange a follow-up appointment

## 2018-06-14 NOTE — ED Triage Notes (Signed)
Pt C/O right shoulder pain after a fall from a horse.

## 2018-06-14 NOTE — ED Provider Notes (Signed)
[facility] EMERGENCY DEPARTMENT Provider Note   CSN: [id number] Arrival date & time: 06/14/18  2025     History   Chief Complaint Chief Complaint  Patient presents with  . Shoulder Pain    HPI Alicia Wheeler is a 32 y.o. female.  HPI   Alicia Wheeler is a 32 y.o. female who presents to the Emergency Department complaining of right shoulder pain and pain with shoulder movement that began a few hours prior to arrival.  States that she was riding a horse bareback and fell off, landing on her right shoulder.  Reports feeling a "pop" and noticed a deformity over her collarbone.  She denies neck pain, head injury, LOC, back pain, numbness of the extremity and pain distal to the shoulder. Has applied ice with some relief  Past Medical History:  Diagnosis Date  . Hx of adenoidectomy   . Hx of tonsillectomy   . PPH (postpartum hemorrhage) 2014   Hx and blood transfusion with last delivery.  . Pregnant 10/30/2014    Patient Active Problem List   Diagnosis Date Noted  . Active labor 06/14/2015  . H/O postpartum hemorrhage, currently pregnant 04/30/2015  . GBS bacteriuria 01/22/2015  . Supervision of normal pregnancy 10/30/2014    Past Surgical History:  Procedure Laterality Date  . TONSILLECTOMY AND ADENOIDECTOMY       OB History    Gravida  2   Para  2   Term  2   Preterm      AB      Living  2     SAB      TAB      Ectopic      Multiple  0   Live Births  2            Home Medications    Prior to Admission medications   Medication Sig Start Date End Date Taking? Authorizing Provider  azithromycin (ZITHROMAX Z-PAK) 250 MG tablet Take 2 tablets (500 mg) on  Day 1,  followed by 1 tablet (250 mg) once daily on Days 2 through 5. 04/12/18   Luking, Jonna Coup, MD  fexofenadine-pseudoephedrine (ALLEGRA-D) 60-120 MG 12 hr tablet Take 1 tablet by mouth 2 (two) times daily.    [provider]  Prenatal Vit-Fe Fumarate-FA (PRENATAL MULTIVITAMIN)  TABS tablet Take 1 tablet by mouth at bedtime.    [provider]    Family History Family History  Problem Relation Age of Onset  . Kidney failure Paternal Grandfather   . Kidney failure Paternal Grandmother   . Cancer Maternal Grandmother        stomach  . Cancer Maternal Grandfather        bone  . Hyperlipidemia Father   . Hyperlipidemia Mother     Social History Social History   Tobacco Use  . Smoking status: Never Smoker  . Smokeless tobacco: Never Used  Substance Use Topics  . Alcohol use: No  . Drug use: No     Allergies   Patient has no known allergies.   Review of Systems Review of Systems  Constitutional: Negative for chills and fever.  Respiratory: Negative for shortness of breath.   Cardiovascular: Negative for chest pain.  Gastrointestinal: Negative for abdominal pain and vomiting.  Genitourinary: Negative for flank pain and hematuria.  Musculoskeletal: Positive for arthralgias (right shoulder pain) and joint swelling. Negative for back pain and neck pain.  Skin: Negative for color change and wound.  Neurological: Negative for syncope,  numbness and headaches.  Psychiatric/Behavioral: Negative for confusion.     Physical Exam Updated Vital Signs BP 120/86 (BP Location: Left Arm)   Pulse 80   Temp 98.6 F (37 C) (Temporal)   Resp 18   Ht 5\' 3"  (1.6 m)   Wt 74.8 kg (165 lb)   SpO2 97%   BMI 29.23 kg/m   Physical Exam  Constitutional: She appears well-developed and well-nourished. No distress.  HENT:  Head: Atraumatic.  Eyes: Pupils are equal, round, and reactive to light. EOM are normal.  Neck: Normal range of motion and full passive range of motion without pain.  Cardiovascular: Normal rate, regular rhythm and intact distal pulses.  Pulmonary/Chest: Effort normal and breath sounds normal. No respiratory distress. She exhibits no tenderness.  Abdominal: Soft. There is no tenderness.  Musculoskeletal: She exhibits tenderness. She  exhibits no edema.  Focal ttp of the distal right clavicle with step off deformity noted.  No open wounds.  Right elbow, wrist are non-tender. Grip strength strong and symmetrical.     Neurological: She is alert. No sensory deficit. Coordination normal.  Skin: Skin is warm. Capillary refill takes less than 2 seconds.  Nursing note and vitals reviewed.    ED Treatments / Results  Labs (all labs ordered are listed, but only abnormal results are displayed) Labs Reviewed - No data to display  EKG None  Radiology Dg Shoulder Right  Result Date: 06/14/2018 CLINICAL DATA:  Right shoulder pain due to a fall off a horse today. Initial encounter. EXAM: RIGHT SHOULDER - 2+ VIEW COMPARISON:  None. FINDINGS: The patient has a transverse fracture through junction of the mid and distal thirds of the right clavicle. There is 1 shaft width inferior displacement of the distal fragment. The humeral head is located and the acromioclavicular joint is intact. IMPRESSION: Displaced fracture of the distal diaphysis of the right clavicle as described. Electronically Signed   By: Drusilla Kannerhomas  Dalessio M.D.   On: 06/14/2018 21:34    Procedures Procedures (including critical care time)  Medications Ordered in ED Medications  HYDROcodone-acetaminophen (NORCO/VICODIN) 5-325 MG per tablet 1 tablet (has no administration in time range)     Initial Impression / Assessment and Plan / ED Course  I have reviewed the triage vital signs and the nursing notes.  Pertinent labs & imaging results that were available during my care of the patient were reviewed by me and considered in my medical decision making (see chart for details).     XR results discussed with pt and importance of close orthopedic f/u.  Pt verbalized understanding.    Sling applied.  NV intact.  Pain improved.  Pt is not pregnant or breastfeeding.  Agrees to ibuprofen and ice, rx for pain medication    Final Clinical Impressions(s) / ED Diagnoses    Final diagnoses:  Closed displaced fracture of shaft of right clavicle, initial encounter    ED Discharge Orders    None       Pauline Ausriplett, Tandy Grawe, PA-C 06/15/18 1656    Margarita Grizzleay, Danielle, MD 06/16/18 1455

## 2018-06-22 ENCOUNTER — Encounter: Payer: Self-pay | Admitting: Orthopaedic Surgery

## 2018-06-22 ENCOUNTER — Ambulatory Visit (INDEPENDENT_AMBULATORY_CARE_PROVIDER_SITE_OTHER): Payer: Self-pay

## 2018-06-22 ENCOUNTER — Ambulatory Visit: Payer: Self-pay | Admitting: Orthopaedic Surgery

## 2018-06-22 VITALS — BP 128/86 | HR 72 | Temp 98.3°F | Ht 64.0 in | Wt 163.0 lb

## 2018-06-22 DIAGNOSIS — S42024A Nondisplaced fracture of shaft of right clavicle, initial encounter for closed fracture: Secondary | ICD-10-CM

## 2018-06-22 DIAGNOSIS — S42031A Displaced fracture of lateral end of right clavicle, initial encounter for closed fracture: Secondary | ICD-10-CM

## 2018-06-22 NOTE — Progress Notes (Signed)
Subjective:    Patient ID: Alicia Wheeler, female    DOB: 07-15-1986, 32 y.o.   MRN: [id number]  HPI She fell off a horse 06-14-18 and hurt her right shoulder.  X-rays showed a displaced fracture of the right clavicle.  She was given a sling and told to come here.  She has no other injury. She has pain controlled.  She is doing well with a sling.   Review of Systems  Constitutional: Positive for activity change.  Musculoskeletal: Positive for arthralgias.  All other systems reviewed and are negative.  For Review of Systems, all other systems reviewed and are negative.  Past Medical History:  Diagnosis Date  . Hx of adenoidectomy   . Hx of tonsillectomy   . PPH (postpartum hemorrhage) 2014   Hx and blood transfusion with last delivery.  . Pregnant 10/30/2014    Past Surgical History:  Procedure Laterality Date  . TONSILLECTOMY AND ADENOIDECTOMY      Current Outpatient Medications on File Prior to Visit  Medication Sig Dispense Refill  . fexofenadine-pseudoephedrine (ALLEGRA-D) 60-120 MG 12 hr tablet Take 1 tablet by mouth 2 (two) times daily.    Marland Kitchen HYDROcodone-acetaminophen (NORCO/VICODIN) 5-325 MG tablet Take one tab po q 4 hrs prn pain 15 tablet 0  . azithromycin (ZITHROMAX Z-PAK) 250 MG tablet Take 2 tablets (500 mg) on  Day 1,  followed by 1 tablet (250 mg) once daily on Days 2 through 5. (Patient not taking: Reported on 06/22/2018) 6 each 0   No current facility-administered medications on file prior to visit.     Social History   Socioeconomic History  . Marital status: Single    Spouse name: Not on file  . Number of children: Not on file  . Years of education: Not on file  . Highest education level: Not on file  Occupational History  . Not on file  Social Needs  . Financial resource strain: Not on file  . Food insecurity:    Worry: Not on file    Inability: Not on file  . Transportation needs:    Medical: Not on file    Non-medical: Not on file  Tobacco  Use  . Smoking status: Never Smoker  . Smokeless tobacco: Never Used  Substance and Sexual Activity  . Alcohol use: No  . Drug use: No  . Sexual activity: Yes    Birth control/protection: None, Coitus interruptus  Lifestyle  . Physical activity:    Days per week: Not on file    Minutes per session: Not on file  . Stress: Not on file  Relationships  . Social connections:    Talks on phone: Not on file    Gets together: Not on file    Attends religious service: Not on file    Active member of club or organization: Not on file    Attends meetings of clubs or organizations: Not on file    Relationship status: Not on file  . Intimate partner violence:    Fear of current or ex partner: Not on file    Emotionally abused: Not on file    Physically abused: Not on file    Forced sexual activity: Not on file  Other Topics Concern  . Not on file  Social History Narrative  . Not on file    Family History  Problem Relation Age of Onset  . Kidney failure Paternal Grandfather   . Kidney failure Paternal Grandmother   . Cancer Maternal Grandmother  stomach  . Cancer Maternal Grandfather        bone  . Hyperlipidemia Father   . Hyperlipidemia Mother     BP 128/86   Pulse 72   Temp 98.3 F (36.8 C)   Ht 5\' 4"  (1.626 m)   Wt 163 lb (73.9 kg)   LMP 06/17/2018 (Approximate)   BMI 27.98 kg/m   Body mass index is 27.98 kg/m.     Objective:   Physical Exam  Constitutional: She is oriented to person, place, and time. She appears well-developed and well-nourished.  HENT:  Head: Normocephalic and atraumatic.  Eyes: Pupils are equal, round, and reactive to light. Conjunctivae and EOM are normal.  Neck: Normal range of motion. Neck supple.  Cardiovascular: Normal rate, regular rhythm and intact distal pulses.  Pulmonary/Chest: Effort normal.  Abdominal: Soft.  Musculoskeletal:       Right shoulder: She exhibits decreased range of motion, tenderness, bony tenderness,  swelling and deformity.       Arms: Neurological: She is alert and oriented to person, place, and time. She has normal reflexes. She displays normal reflexes. No cranial nerve deficit. She exhibits normal muscle tone. Coordination normal.  Skin: Skin is warm and dry.  Psychiatric: She has a normal mood and affect. Her behavior is normal. Judgment and thought content normal.     X-rays were done of the right clavicle, reported separately.     Assessment & Plan:   Encounter Diagnoses  Name Primary?  . Closed nondisplaced fracture of shaft of right clavicle, initial encounter   . Closed displaced fracture of acromial end of right clavicle, initial encounter Yes   She is to use the sling.  I will see her in two weeks.  X-rays then.  Call if any problem.  Precautions discussed.   Electronically Signed Darreld McleanWayne Any Mcneice, MD 7/9/201910:22 AM

## 2018-07-06 ENCOUNTER — Ambulatory Visit (INDEPENDENT_AMBULATORY_CARE_PROVIDER_SITE_OTHER): Payer: Self-pay | Admitting: Orthopaedic Surgery

## 2018-07-06 ENCOUNTER — Ambulatory Visit (INDEPENDENT_AMBULATORY_CARE_PROVIDER_SITE_OTHER): Payer: Self-pay

## 2018-07-06 ENCOUNTER — Encounter: Payer: Self-pay | Admitting: Orthopaedic Surgery

## 2018-07-06 VITALS — BP 125/62 | HR 71 | Temp 97.9°F | Ht 63.0 in | Wt 163.0 lb

## 2018-07-06 DIAGNOSIS — S42024A Nondisplaced fracture of shaft of right clavicle, initial encounter for closed fracture: Secondary | ICD-10-CM

## 2018-07-06 NOTE — Progress Notes (Signed)
CC:  My collar bone is not hurting as bad today  She has less pain of the right clavicle and is using her arm better.  NV intact. ROM of the right shoulder is full.  She has a prominence of the right clavicle mid shaft.  Encounter Diagnosis  Name Primary?  . Closed nondisplaced fracture of shaft of right clavicle, initial encounter Yes   Return in three weeks.  X-rays on return.  Call if any problem.  Precautions discussed.   Electronically Signed Darreld McleanWayne Timberlynn Kizziah, MD 7/23/201910:45 AM

## 2018-07-09 ENCOUNTER — Telehealth: Payer: Self-pay | Admitting: Orthopaedic Surgery

## 2018-07-09 NOTE — Telephone Encounter (Signed)
Patient called wanting to ask you if she could start lifting maybe 5 to 10 lbs or should she wait until she comes back.  Please advise

## 2018-07-12 ENCOUNTER — Encounter: Payer: Self-pay | Admitting: Orthopaedic Surgery

## 2018-07-12 NOTE — Telephone Encounter (Signed)
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 Note issued accordingly.

## 2018-07-12 NOTE — Telephone Encounter (Signed)
Called patient, reached voice mail - left message to return call regarding work note, per Dr Sanjuan DameKeeling's response.

## 2018-07-12 NOTE — Telephone Encounter (Signed)
Change note to say lift up to 10 pounds per her request

## 2018-07-27 ENCOUNTER — Encounter: Payer: Self-pay | Admitting: Orthopaedic Surgery

## 2018-07-27 ENCOUNTER — Ambulatory Visit: Payer: Self-pay | Admitting: Orthopaedic Surgery

## 2018-08-04 ENCOUNTER — Encounter: Payer: Self-pay | Admitting: Orthopaedic Surgery

## 2018-08-04 ENCOUNTER — Ambulatory Visit: Payer: Self-pay | Admitting: Orthopaedic Surgery

## 2018-08-04 ENCOUNTER — Ambulatory Visit (INDEPENDENT_AMBULATORY_CARE_PROVIDER_SITE_OTHER): Payer: Self-pay

## 2018-08-04 DIAGNOSIS — S42024D Nondisplaced fracture of shaft of right clavicle, subsequent encounter for fracture with routine healing: Secondary | ICD-10-CM

## 2018-08-04 NOTE — Progress Notes (Signed)
CC:  I have no pain  She has done well from clavicle fracture on the right.  She has no problem, no pain.  NV intact. ROM of the right shoulder is full and neck has full ROM.  X-rays were done of the right clavicle, reported separately.  Encounter Diagnosis  Name Primary?  . Closed nondisplaced fracture of shaft of right clavicle with routine healing, subsequent encounter Yes   I will see in one month for final visit.  X-rays on return.  Call if any problem.  Precautions discussed.   Electronically Signed Darreld McleanWayne Aqil Goetting, MD 8/21/20193:17 PM

## 2018-09-01 ENCOUNTER — Ambulatory Visit: Payer: Self-pay | Admitting: Orthopaedic Surgery

## 2021-03-05 ENCOUNTER — Ambulatory Visit
Admission: EM | Admit: 2021-03-05 | Discharge: 2021-03-05 | Disposition: A | Discharge status: Home / Self Care | Payer: Self-pay | Attending: Physician Assistant | Admitting: Physician Assistant

## 2021-03-05 DIAGNOSIS — S61052A Open bite of left thumb without damage to nail, initial encounter: Secondary | ICD-10-CM

## 2021-03-05 DIAGNOSIS — W5501XA Bitten by cat, initial encounter: Secondary | ICD-10-CM

## 2021-03-05 MED ORDER — AMOXICILLIN-POT CLAVULANATE 875-125 MG PO TABS: 1.0000 | ORAL_TABLET | Freq: Two times a day (BID) | ORAL | 0 refills | Status: AC

## 2021-03-05 NOTE — Discharge Instructions (Addendum)
Soak area 20 minutes 4 rimes a day

## 2021-03-05 NOTE — ED Triage Notes (Signed)
Has cat bite from own cat that occurred last night, vaccines up to date

## 2021-03-07 NOTE — ED Provider Notes (Signed)
[facility]    CSN: [id number] Arrival date & time: 03/05/21  1910      History   Chief Complaint Chief Complaint  Patient presents with  . Animal Bite    HPI Alicia Wheeler is a 35 y.o. female.   Pt reports her cat bit her thumb  The history is provided by the patient. No language interpreter was used.  Animal Bite Contact animal:  Cat Pain details:    Quality:  Aching   Severity:  Moderate   Timing:  Constant Animal in possession: no   Relieved by:  Nothing Worsened by:  Nothing Ineffective treatments:  None tried Associated symptoms: no fever and no numbness     Past Medical History:  Diagnosis Date  . Hx of adenoidectomy   . Hx of tonsillectomy   . PPH (postpartum hemorrhage) 2014   Hx and blood transfusion with last delivery.  . Pregnant 10/30/2014    Patient Active Problem List   Diagnosis Date Noted  . Active labor 06/14/2015  . H/O postpartum hemorrhage, currently pregnant 04/30/2015  . GBS bacteriuria 01/22/2015  . Supervision of normal pregnancy 10/30/2014    Past Surgical History:  Procedure Laterality Date  . TONSILLECTOMY AND ADENOIDECTOMY      OB History    Gravida  2   Para  2   Term  2   Preterm      AB      Living  2     SAB      IAB      Ectopic      Multiple  0   Live Births  2            Home Medications    Prior to Admission medications   Medication Sig Start Date End Date Taking? Authorizing Provider  amoxicillin-clavulanate (AUGMENTIN) 875-125 MG tablet Take 1 tablet by mouth 2 (two) times daily for 10 days. 03/05/21 03/15/21 Yes Cheron Schaumann K, PA-C  azithromycin (ZITHROMAX Z-PAK) 250 MG tablet Take 2 tablets (500 mg) on  Day 1,  followed by 1 tablet (250 mg) once daily on Days 2 through 5. Patient not taking: Reported on 06/22/2018 04/12/18   Babs Sciara, MD  fexofenadine-pseudoephedrine (ALLEGRA-D) 60-120 MG 12 hr tablet Take 1 tablet by mouth 2 (two) times daily.    [provider]  HYDROcodone-acetaminophen (NORCO/VICODIN) 5-325 MG tablet Take one tab po q 4 hrs prn pain 06/14/18   Pauline Aus, PA-C    Family History Family History  Problem Relation Age of Onset  . Kidney failure Paternal Grandfather   . Kidney failure Paternal Grandmother   . Cancer Maternal Grandmother        stomach  . Cancer Maternal Grandfather        bone  . Hyperlipidemia Father   . Hyperlipidemia Mother     Social History Social History   Tobacco Use  . Smoking status: Never Smoker  . Smokeless tobacco: Never Used  Substance Use Topics  . Alcohol use: No  . Drug use: No     Allergies   Patient has no known allergies.   Review of Systems Review of Systems  Constitutional: Negative for fever.  Neurological: Negative for numbness.  All other systems reviewed and are negative.    Physical Exam Triage Vital Signs ED Triage Vitals [03/05/21 1922]  Enc Vitals Group     BP 132/84     Pulse Rate 64     Resp  18     Temp 98.2 F (36.8 C)     Temp src      SpO2 97 %     Weight      Height      Head Circumference      Peak Flow      Pain Score      Pain Loc      Pain Edu?      Excl. in GC?    No data found.  Updated Vital Signs BP 132/84   Pulse 64   Temp 98.2 F (36.8 C)   Resp 18   SpO2 97%   Visual Acuity Right Eye Distance:   Left Eye Distance:   Bilateral Distance:    Right Eye Near:   Left Eye Near:    Bilateral Near:     Physical Exam Vitals reviewed.  Constitutional:      Appearance: Normal appearance.  Musculoskeletal:        General: Normal range of motion.  Skin:    General: Skin is warm.     Comments: puncture wound thimb, slight redness  nv and ns intact  Neurological:     General: No focal deficit present.     Mental Status: She is alert.  Psychiatric:        Mood and Affect: Mood normal.      UC Treatments / Results  Labs (all labs ordered are listed, but only abnormal results are displayed) Labs  Reviewed - No data to display  EKG   Radiology No results found.  Procedures Procedures (including critical care time)  Medications Ordered in UC Medications - No data to display  Initial Impression / Assessment and Plan / UC Course  I have reviewed the triage vital signs and the nursing notes.  Pertinent labs & imaging results that were available during my care of the patient were reviewed by me and considered in my medical decision making (see chart for details).     MDM:  Pt given rx for augmentin.Pt advised of high risk of infection and need to watch carefully  Final Clinical Impressions(s) / UC Diagnoses   Final diagnoses:  Cat bite of thumb, left, initial encounter     Discharge Instructions     Soak area 20 minutes 4 rimes a day    ED Prescriptions    Medication Sig Dispense Auth. Provider   amoxicillin-clavulanate (AUGMENTIN) 875-125 MG tablet Take 1 tablet by mouth 2 (two) times daily for 10 days. 20 tablet Elson Areas, [place]     PDMP not reviewed this encounter.  An After Visit Summary was printed and given to the patient.    Elson Areas, [place] 03/07/21 1405

## 2021-04-16 ENCOUNTER — Other Ambulatory Visit: Payer: Self-pay

## 2021-04-16 ENCOUNTER — Encounter: Payer: Self-pay | Admitting: Emergency Medicine

## 2021-04-16 ENCOUNTER — Ambulatory Visit
Admission: EM | Admit: 2021-04-16 | Discharge: 2021-04-16 | Disposition: A | Discharge status: Home / Self Care | Payer: Self-pay | Attending: Family Medicine | Admitting: Family Medicine

## 2021-04-16 DIAGNOSIS — R0982 Postnasal drip: Secondary | ICD-10-CM

## 2021-04-16 NOTE — ED Triage Notes (Signed)
Fever, body aches and nasal congestion Sunday and Monday.  Feels better today but needs a note to go back to work.

## 2021-04-16 NOTE — ED Provider Notes (Signed)
  [facility]   [id number] 04/16/21 Arrival Time: 4742  ASSESSMENT & PLAN:  1. Post-nasal drip    Work note given.   Follow-up Information    Luking, Jonna Coup, MD.   Specialty: Family Medicine Why: As needed. Contact information: [address] [place] [address] [phone]               Reviewed expectations re: course of current medical issues. Questions answered. Outlined signs and symptoms indicating need for more acute intervention. Understanding verbalized. After Visit Summary given.   SUBJECTIVE: History from: patient. Alicia Wheeler is a 35 y.o. female who reports "feeling bad"; past 1-2 d; T 99.4F; mild post nasal drip; feeling better today. Needs note to return to work.   OBJECTIVE:  Vitals:   04/16/21 0935  BP: 122/72  Pulse: 75  Resp: 17  Temp: 97.9 F (36.6 C)  TempSrc: Oral  SpO2: 97%    HENT: [place]; AT; with mild nasal congestion Lungs: speaks full sentences without difficulty; unlabored Skin: warm and dry Neurologic: normal gait Psychological: alert and cooperative; normal mood and affect  No Known Allergies  Past Medical History:  Diagnosis Date  . Hx of adenoidectomy   . Hx of tonsillectomy   . PPH (postpartum hemorrhage) 2014   Hx and blood transfusion with last delivery.  . Pregnant 10/30/2014   Social History   Socioeconomic History  . Marital status: Single    Spouse name: Not on file  . Number of children: Not on file  . Years of education: Not on file  . Highest education level: Not on file  Occupational History  . Not on file  Tobacco Use  . Smoking status: Never Smoker  . Smokeless tobacco: Never Used  Substance and Sexual Activity  . Alcohol use: No  . Drug use: No  . Sexual activity: Yes    Birth control/protection: None, Coitus interruptus  Other Topics Concern  . Not on file  Social History Narrative  . Not on file   Social Determinants of Health   Financial Resource Strain:  Not on file  Food Insecurity: Not on file  Transportation Needs: Not on file  Physical Activity: Not on file  Stress: Not on file  Social Connections: Not on file  Intimate Partner Violence: Not on file   Family History  Problem Relation Age of Onset  . Kidney failure Paternal Grandfather   . Kidney failure Paternal Grandmother   . Cancer Maternal Grandmother        stomach  . Cancer Maternal Grandfather        bone  . Hyperlipidemia Father   . Hyperlipidemia Mother    Past Surgical History:  Procedure Laterality Date  . TONSILLECTOMY AND ADENOIDECTOMY       Mardella Layman, MD 04/16/21 [phone]

## 2022-04-05 ENCOUNTER — Emergency Department (HOSPITAL_COMMUNITY): Payer: BC Managed Care – PPO

## 2022-04-05 ENCOUNTER — Encounter (HOSPITAL_COMMUNITY): Payer: Self-pay | Admitting: *Deleted

## 2022-04-05 ENCOUNTER — Emergency Department (HOSPITAL_COMMUNITY)
Admission: EM | Admit: 2022-04-05 | Discharge: 2022-04-05 | Disposition: A | Discharge status: Home / Self Care | Payer: BC Managed Care – PPO | Attending: Emergency Medicine | Admitting: Emergency Medicine

## 2022-04-05 ENCOUNTER — Other Ambulatory Visit: Payer: Self-pay

## 2022-04-05 DIAGNOSIS — R1031 Right lower quadrant pain: Secondary | ICD-10-CM | POA: Diagnosis present

## 2022-04-05 DIAGNOSIS — B9689 Other specified bacterial agents as the cause of diseases classified elsewhere: Secondary | ICD-10-CM | POA: Diagnosis not present

## 2022-04-05 DIAGNOSIS — R102 Pelvic and perineal pain: Secondary | ICD-10-CM | POA: Insufficient documentation

## 2022-04-05 DIAGNOSIS — N760 Acute vaginitis: Secondary | ICD-10-CM | POA: Diagnosis not present

## 2022-04-05 LAB — URINALYSIS, ROUTINE W REFLEX MICROSCOPIC
Bilirubin Urine: NEGATIVE
Glucose, UA: NEGATIVE mg/dL
Hgb urine dipstick: NEGATIVE
Ketones, ur: NEGATIVE mg/dL
Nitrite: NEGATIVE
Protein, ur: 30 mg/dL — AB
Specific Gravity, Urine: 1.04 — ABNORMAL HIGH (ref 1.005–1.030)
pH: 5 (ref 5.0–8.0)

## 2022-04-05 LAB — CBC WITH DIFFERENTIAL/PLATELET
Abs Immature Granulocytes: 0 10*3/uL (ref 0.00–0.07)
Basophils Absolute: 0 10*3/uL (ref 0.0–0.1)
Basophils Relative: 0 %
Eosinophils Absolute: 0.1 10*3/uL (ref 0.0–0.5)
Eosinophils Relative: 2 %
HCT: 39.2 % (ref 36.0–46.0)
Hemoglobin: 13 g/dL (ref 12.0–15.0)
Immature Granulocytes: 0 %
Lymphocytes Relative: 36 %
Lymphs Abs: 1.8 10*3/uL (ref 0.7–4.0)
MCH: 31.5 pg (ref 26.0–34.0)
MCHC: 33.2 g/dL (ref 30.0–36.0)
MCV: 94.9 fL (ref 80.0–100.0)
Monocytes Absolute: 0.4 10*3/uL (ref 0.1–1.0)
Monocytes Relative: 8 %
Neutro Abs: 2.6 10*3/uL (ref 1.7–7.7)
Neutrophils Relative %: 54 %
Platelets: 182 10*3/uL (ref 150–400)
RBC: 4.13 MIL/uL (ref 3.87–5.11)
RDW: 12.1 % (ref 11.5–15.5)
WBC: 4.8 10*3/uL (ref 4.0–10.5)
nRBC: 0 % (ref 0.0–0.2)

## 2022-04-05 LAB — BASIC METABOLIC PANEL
Anion gap: 5 (ref 5–15)
BUN: 17 mg/dL (ref 6–20)
CO2: 26 mmol/L (ref 22–32)
Calcium: 9 mg/dL (ref 8.9–10.3)
Chloride: 106 mmol/L (ref 98–111)
Creatinine, Ser: 0.94 mg/dL (ref 0.44–1.00)
GFR, Estimated: >60 mL/min (ref >60)
Glucose, Bld: 105 mg/dL — ABNORMAL HIGH (ref 70–99)
Potassium: 4.2 mmol/L (ref 3.5–5.1)
Sodium: 137 mmol/L (ref 135–145)

## 2022-04-05 LAB — WET PREP, GENITAL
Sperm: NONE SEEN
Trich, Wet Prep: NONE SEEN
WBC, Wet Prep HPF POC: >=10 — AB (ref <10)
Yeast Wet Prep HPF POC: NONE SEEN

## 2022-04-05 LAB — I-STAT BETA HCG BLOOD, ED (MC, WL, AP ONLY): I-stat hCG, quantitative: <5 m[IU]/mL (ref <5)

## 2022-04-05 MED ORDER — METRONIDAZOLE 500 MG PO TABS: 500.0000 mg | ORAL_TABLET | Freq: Two times a day (BID) | ORAL | 0 refills | Status: DC

## 2022-04-05 MED ORDER — KETOROLAC TROMETHAMINE 15 MG/ML IJ SOLN
15.0000 mg | Freq: Once | INTRAMUSCULAR | Status: AC
  Administered 2022-04-05: 15 mg via INTRAVENOUS
  Filled 2022-04-05: qty 1

## 2022-04-05 MED ORDER — LACTATED RINGERS IV BOLUS
1000.0000 mL | Freq: Once | INTRAVENOUS | Status: AC
  Administered 2022-04-05: 1000 mL via INTRAVENOUS

## 2022-04-05 MED ORDER — OXYCODONE-ACETAMINOPHEN 5-325 MG PO TABS: 1.0000 | ORAL_TABLET | Freq: Four times a day (QID) | ORAL | 0 refills | Status: DC | PRN

## 2022-04-05 MED ORDER — IOHEXOL 300 MG/ML  SOLN
100.0000 mL | Freq: Once | INTRAMUSCULAR | Status: AC | PRN
  Administered 2022-04-05: 100 mL via INTRAVENOUS

## 2022-04-05 NOTE — ED Triage Notes (Signed)
The pt is c/o rt lower abdomen and rt lower flank pain since yesterday am.  Pain worse through the night  lmp last month ?

## 2022-04-05 NOTE — Discharge Instructions (Addendum)
You were seen here today for evaluation of your right lower quadrant pain. Your lab work showed possible signs of UTI, although I am obtaining a urine culture.  If this is positive, someone will call you.  Your imaging did show an enlarged right ovary.  I discussed this with gynecology who recommends follow-up with your gynecologist however there is no emergent intervention at this time.  I am prescribing you a few Percocet for your abdominal pain to take at night.  Please make sure to not drive or operate machinery while on this medication as it can make you drowsy. You wet prep is pending, I will call you if there is any other medications that need to be sent in. If you have any worsening pain, new or worsening symptoms, please return to the nearest emergency department for reevaluation. ? ?Contact a doctor if: ?Your belly pain changes or gets worse. ?You are not hungry, or you lose weight without trying. ?You are having trouble pooping (constipated) or have watery poop (diarrhea) for more than 2-3 days. ?You have pain when you pee or poop. ?Your belly pain wakes you up at night. ?Your pain gets worse with meals, after eating, or with certain foods. ?You are vomiting and cannot keep anything down. ?You have a fever. ?You have blood in your pee. ?Get help right away if: ?Your pain does not go away as soon as your doctor says it should. ?You cannot stop vomiting. ?Your pain is only in areas of your belly, such as the right side or the left lower part of the belly. ?You have bloody or black poop, or poop that looks like tar. ?You have very bad pain, cramping, or bloating in your belly. ?You have signs of not having enough fluid or water in your body (dehydration), such as: ?Dark pee, very little pee, or no pee. ?Cracked lips. ?Dry mouth. ?Sunken eyes. ?Sleepiness. ?Weakness. ?You have trouble breathing or chest pain. ?

## 2022-04-05 NOTE — ED Provider Notes (Signed)
?[facility] ?Provider Note ? ? ?CSN: [id number] ?Arrival date & time: 04/05/22  [id number] ? ?  ? ?History ?Chief Complaint  ?Patient presents with  ? Abdominal Pain  ? ? ?Alicia Wheeler is a 36 y.o. female otherwise healthy presents emerged department for evaluation of right lower quadrant abdominal pain radiating to her right lower back.  She reports it started yesterday around 1000, but worsened last night prompting her to come to the emergency department.  She had tried Tylenol and ibuprofen without relief.  She denies any fever, melena, hematochezia, diarrhea, constipation, dysuria, hematuria, nausea, vomiting, vaginal discharge, vaginal bleeding.  The patient reports she does not have any pain now and is feeling better but was worried what was causing her pain last night.  She denies any medical or surgical history.  Denies any daily medications.  No known drug allergies.  Denies any tobacco, EtOH, other drug use ever. ? ? ?Abdominal Pain ?Associated symptoms: no chest pain, no chills, no constipation, no diarrhea, no dysuria, no fever, no hematuria, no nausea, no shortness of breath, no vaginal bleeding, no vaginal discharge and no vomiting   ? ?  ? ?Home Medications ?Prior to Admission medications   ?Medication Sig Start Date End Date Taking? Authorizing Provider  ?fexofenadine-pseudoephedrine (ALLEGRA-D) 60-120 MG 12 hr tablet Take 1 tablet by mouth 2 (two) times daily.    [provider]  ?HYDROcodone-acetaminophen (NORCO/VICODIN) 5-325 MG tablet Take one tab po q 4 hrs prn pain 06/14/18   Triplett, Tammy, PA-C  ?   ? ?Allergies    ?Patient has no known allergies.   ? ?Review of Systems   ?Review of Systems  ?Constitutional:  Negative for chills and fever.  ?Respiratory:  Negative for shortness of breath.   ?Cardiovascular:  Negative for chest pain.  ?Gastrointestinal:  Positive for abdominal pain. Negative for blood in stool, constipation, diarrhea, nausea and  vomiting.  ?Genitourinary:  Negative for dysuria, flank pain, hematuria, vaginal bleeding and vaginal discharge.  ?Musculoskeletal:  Positive for back pain.  ? ?Physical Exam ?Updated Vital Signs ?BP 107/74 (BP Location: Right Arm)   Pulse (!) 56   Temp 98.1 ?F (36.7 ?C) (Oral)   Resp 15   Ht 5\' 3"  (1.6 m)   Wt 58.5 kg   LMP 03/05/2022   SpO2 98%   BMI 22.85 kg/m?  ?Physical Exam ?Vitals and nursing note reviewed. Exam conducted with a chaperone present Alicia Wheeler([place]).  ?Constitutional:   ?   General: She is not in acute distress. ?   Appearance: Normal appearance. She is not toxic-appearing.  ?HENT:  ?   Head: Normocephalic and atraumatic.  ?Eyes:  ?   General: No scleral icterus. ?Cardiovascular:  ?   Rate and Rhythm: Normal rate and regular rhythm.  ?Pulmonary:  ?   Effort: Pulmonary effort is normal. No respiratory distress.  ?   Breath sounds: Normal breath sounds.  ?Abdominal:  ?   General: Abdomen is flat. Bowel sounds are normal. There is no distension.  ?   Palpations: Abdomen is soft.  ?   Tenderness: There is abdominal tenderness in the right lower quadrant. There is no right CVA tenderness, left CVA tenderness, guarding or rebound.  ?   Comments: She reports that she has more discomfort to palpation than pain in her right lower quadrant.  Abdomen soft and nondistended.  No overlying skin changes noted other than the tattoo.  Normal active bowel sounds.  No peritoneal signs  with foot tap.  ?Genitourinary: ?   Comments: Slightly enlarged cervix although not erythematous, mildly friable the patient reports she is about to start her menstrual cycle.  She has absolutely no CMT tenderness.  Some moderate amount of thin white discharge seen in the vaginal canal. ?Musculoskeletal:     ?   General: No deformity.  ?   Cervical back: Normal range of motion.  ?   Comments: No midline or paraspinal tenderness palpation.  Negative straight leg raises.  ?Skin: ?   General: Skin is warm and dry.  ?Neurological:   ?   General: No focal deficit present.  ?   Mental Status: She is alert. Mental status is at baseline.  ? ? ?ED Results / Procedures / Treatments   ?Labs ?(all labs ordered are listed, but only abnormal results are displayed) ?Labs Reviewed  ?URINALYSIS, ROUTINE W REFLEX MICROSCOPIC - Abnormal; Notable for the following components:  ?    Result Value  ? APPearance HAZY (*)   ? Specific Gravity, Urine 1.040 (*)   ? Protein, ur 30 (*)   ? Leukocytes,Ua MODERATE (*)   ? Bacteria, UA RARE (*)   ? All other components within normal limits  ?BASIC METABOLIC PANEL - Abnormal; Notable for the following components:  ? Glucose, Bld 105 (*)   ? All other components within normal limits  ?URINE CULTURE  ?CBC WITH DIFFERENTIAL/PLATELET  ?I-STAT BETA HCG BLOOD, ED ([facility], [facility], [facility] ONLY)  ? ? ?EKG ?None ? ?Radiology ?CT ABDOMEN PELVIS W CONTRAST ? ?Result Date: 04/05/2022 ?CLINICAL DATA:  Right lower quadrant abdominal pain. EXAM: CT ABDOMEN AND PELVIS WITH CONTRAST TECHNIQUE: Multidetector CT imaging of the abdomen and pelvis was performed using the standard protocol following bolus administration of intravenous contrast. RADIATION DOSE REDUCTION: This exam was performed according to the departmental dose-optimization program which includes automated exposure control, adjustment of the mA and/or kV according to patient size and/or use of iterative reconstruction technique. CONTRAST:  OMNIPAQUE IOHEXOL 300 MG/ML  SOLN COMPARISON:  April 05, 2022, no contrasted abdominal CT. FINDINGS: Lower chest: No acute abnormality. Hepatobiliary: No focal liver abnormality is seen. No gallstones, gallbladder wall thickening, or biliary dilatation. Pancreas: Unremarkable. No pancreatic ductal dilatation or surrounding inflammatory changes. Spleen: Normal in size without focal abnormality. Adrenals/Urinary Tract: Adrenal glands are unremarkable. Kidneys are normal, without renal calculi, focal lesion, or hydronephrosis. Bladder is unremarkable.  Stomach/Bowel: Stomach is within normal limits. The appendix is not identified with certainty, however there are no secondary signs of appendicitis. No evidence of bowel wall thickening, distention, or inflammatory changes. Vascular/Lymphatic: No significant vascular findings are present. No enlarged abdominal or pelvic lymph nodes. Reproductive: Again seen is an area hypoattenuation in the right adnexa measuring approximately 4.8 x 2.7 cm. Other: No abdominal wall hernia or abnormality. No abdominopelvic ascites. Musculoskeletal: No acute or significant osseous findings. IMPRESSION: 1. Nonvisualization of the appendix, however there are no secondary signs of appendicitis. 2. Again seen is an area hypoattenuation in the right adnexa measuring approximately 4.8 x 2.7 cm. This may represent prominent right ovary or right fallopian tube dilation. Given negative pelvic ultrasound adnexal mass is less likely. Electronically Signed   By: Ted Mcalpine M.D.   On: 04/05/2022 15:06  ? ?CT Renal Stone Study ? ?Result Date: 04/05/2022 ?CLINICAL DATA:  Flank pain with kidney stone suspected EXAM: CT ABDOMEN AND PELVIS WITHOUT CONTRAST TECHNIQUE: Multidetector CT imaging of the abdomen and pelvis was performed following the standard protocol without  IV contrast. RADIATION DOSE REDUCTION: This exam was performed according to the departmental dose-optimization program which includes automated exposure control, adjustment of the mA and/or kV according to patient size and/or use of iterative reconstruction technique. COMPARISON:  None. FINDINGS: Lower chest:  No contributory findings. Hepatobiliary: No focal liver abnormality.No evidence of biliary obstruction or stone. Pancreas: Unremarkable. Spleen: Unremarkable. Adrenals/Urinary Tract: Negative adrenals. No hydronephrosis or stone. Unremarkable bladder. Stomach/Bowel:  No obstruction. No appendicitis. Vascular/Lymphatic: No acute vascular abnormality. No mass or adenopathy.  Reproductive:Low-density in the right hemipelvis not clearly layering in the peritoneal recesses to imply free pelvic fluid, area measuring nearly 4 cm. Other: No ascites or pneumoperitoneum. Musculoskeletal: No a

## 2022-04-05 NOTE — ED Notes (Signed)
Patient transported to Ultrasound 

## 2022-04-05 NOTE — ED Provider Triage Note (Signed)
Emergency Medicine Provider Triage Evaluation Note ? ?Alicia Wheeler , a 36 y.o. female  was evaluated in triage.  Pt complains of right flank pain since last evening.  States coming in waves, radiates to back and right groin.  No nausea/vomiting.  Denies hematuria.  No prior abdominal surgeries. ? ?Review of Systems  ?Positive: Flank pain ?Negative: Fever/chills ? ?Physical Exam  ?BP 122/84   Pulse 73   Temp 97.7 ?F (36.5 ?C)   Resp 16   SpO2 100%  ?Gen:   Awake, no distress   ?Resp:  Normal effort  ?MSK:   Moves extremities without difficulty  ?Other:  Abdomen soft, non-tender to palpation ? ?Medical Decision Making  ?Medically screening exam initiated at 6:28 AM.  Appropriate orders placed.  Alicia Wheeler was informed that the remainder of the evaluation will be completed by another provider, this initial triage assessment does not replace that evaluation, and the importance of remaining in the ED until their evaluation is complete. ? ?Right flank pain.  Will check labs, UA, CT renal stone study. ?  ?Alicia Hatchet, PA-C ?04/05/22 [id number] ? ?

## 2022-04-05 NOTE — ED Notes (Signed)
C-t came to get this pt but beta  hcg not resulted ?

## 2022-04-06 ENCOUNTER — Emergency Department (HOSPITAL_COMMUNITY)
Admission: EM | Admit: 2022-04-06 | Discharge: 2022-04-06 | Discharge status: Against Medical Advice | Payer: BC Managed Care – PPO | Attending: Emergency Medicine | Admitting: Emergency Medicine

## 2022-04-06 ENCOUNTER — Other Ambulatory Visit: Payer: Self-pay

## 2022-04-06 DIAGNOSIS — Z5321 Procedure and treatment not carried out due to patient leaving prior to being seen by health care provider: Secondary | ICD-10-CM | POA: Insufficient documentation

## 2022-04-06 DIAGNOSIS — R1031 Right lower quadrant pain: Secondary | ICD-10-CM | POA: Insufficient documentation

## 2022-04-06 LAB — URINE CULTURE: Culture: <10000 — AB

## 2022-04-06 NOTE — ED Notes (Signed)
Pt called for X2. Pt could not be found.  

## 2022-04-06 NOTE — ED Triage Notes (Signed)
Pt was seen here yesterday for same was d/c today around 4PM.Pt reports right lower abd & flank pain . Pt reports she had work--up here and the pain was gone.Pt reports they told her to come back if the pain got worse. ?

## 2022-04-07 LAB — GC/CHLAMYDIA PROBE AMP (~~LOC~~) NOT AT ARMC
Chlamydia: NEGATIVE
Comment: NEGATIVE
Comment: NORMAL
Neisseria Gonorrhea: NEGATIVE

## 2022-06-19 ENCOUNTER — Encounter: Payer: Self-pay | Admitting: Adult Health

## 2022-06-19 ENCOUNTER — Ambulatory Visit (INDEPENDENT_AMBULATORY_CARE_PROVIDER_SITE_OTHER): Payer: BC Managed Care – PPO | Admitting: Adult Health

## 2022-06-19 ENCOUNTER — Other Ambulatory Visit (HOSPITAL_COMMUNITY)
Admission: RE | Admit: 2022-06-19 | Discharge: 2022-06-19 | Disposition: A | Discharge status: Home / Self Care | Payer: BC Managed Care – PPO | Source: Ambulatory Visit | Attending: Adult Health | Admitting: Adult Health

## 2022-06-19 VITALS — BP 129/71 | HR 100 | Ht 63.25 in | Wt 133.0 lb

## 2022-06-19 DIAGNOSIS — Z30013 Encounter for initial prescription of injectable contraceptive: Secondary | ICD-10-CM | POA: Insufficient documentation

## 2022-06-19 DIAGNOSIS — Z3202 Encounter for pregnancy test, result negative: Secondary | ICD-10-CM | POA: Diagnosis not present

## 2022-06-19 DIAGNOSIS — Z01419 Encounter for gynecological examination (general) (routine) without abnormal findings: Secondary | ICD-10-CM

## 2022-06-19 LAB — POCT URINE PREGNANCY: Preg Test, Ur: NEGATIVE

## 2022-06-19 MED ORDER — MEDROXYPROGESTERONE ACETATE 150 MG/ML IM SUSP: 150.0000 mg | INTRAMUSCULAR | 4 refills | Status: DC

## 2022-06-19 MED ORDER — MEDROXYPROGESTERONE ACETATE 150 MG/ML IM SUSP
150.0000 mg | Freq: Once | INTRAMUSCULAR | Status: AC
  Administered 2022-06-19: 150 mg via INTRAMUSCULAR

## 2022-06-19 NOTE — Progress Notes (Signed)
Patient ID: Alicia Wheeler, female   DOB: 1986-03-30, 36 y.o.   MRN: [id number] History of Present Illness: Alicia Wheeler is a 36 year old white female,separated, G2P2 in for a well woman gyn exam and pap. 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 She wants to discuss birth control. PCP is Dr Lilyan Punt.   Current Medications, Allergies, Past Medical History, Past Surgical History, Family History and Social History were reviewed in [employer] record.     Review of Systems: Patient denies any headaches, hearing loss, fatigue, blurred vision, shortness of breath, chest pain, abdominal pain, problems with bowel movements, urination, or intercourse. No joint pain or mood swings.     Physical Exam:BP 129/71 (BP Location: Left Arm, Patient Position: Sitting, Cuff Size: Normal)   Pulse 100   Ht 5' 3.25" (1.607 m)   Wt 133 lb (60.3 kg)   LMP 06/05/2022   Breastfeeding No   BMI 23.37 kg/m  UPT is negative  General:  Well developed, well nourished, no acute distress Skin:  Warm and dry Neck:  Midline trachea, normal thyroid, good ROM, no lymphadenopathy Lungs; Clear to auscultation bilaterally Breast:  No dominant palpable mass, retraction, or nipple discharge Cardiovascular: Regular rate and rhythm Abdomen:  Soft, non tender, no hepatosplenomegaly Pelvic:  External genitalia is normal in appearance, no lesions.  The vagina is normal in appearance. Urethra has no lesions or masses. The cervix is bulbous.Pap with HR HPV genotyping performed.  Uterus is felt to be normal size, shape, and contour.  No adnexal masses or tenderness noted.Bladder is non tender, no masses felt. Rectal:Deferred  Extremities/musculoskeletal:  No swelling or varicosities noted, no clubbing or cyanosis Psych:  No mood changes, alert and cooperative,seems happy AA is 2 Fall risk is low    06/19/2022   10:38 AM  Depression screen PHQ 2/9  Decreased Interest 0  Down, Depressed, Hopeless 0  PHQ - 2  Score 0  Altered sleeping 0  Tired, decreased energy 0  Change in appetite 0  Feeling bad or failure about yourself  0  Trouble concentrating 0  Moving slowly or fidgety/restless 0  Suicidal thoughts 0  PHQ-9 Score 0       06/19/2022   10:39 AM  GAD 7 : Generalized Anxiety Score  Nervous, Anxious, on Edge 0  Control/stop worrying 0  Worry too much - different things 0  Trouble relaxing 0  Restless 0  Easily annoyed or irritable 0  Afraid - awful might happen 0  Total GAD 7 Score 0    Upstream - 06/19/22 1036       Pregnancy Intention Screening   Does the patient want to become pregnant in the next year? No    Does the patient's partner want to become pregnant in the next year? No    Would the patient like to discuss contraceptive options today? Yes      Contraception Wrap Up   Current Method Withdrawal or Other Method    End Method Hormonal Injection    Contraception Counseling Provided Yes              Examination chaperoned by Malachy Mood LPN   Impression and Plan: 1. Pregnancy examination or test, negative result  2. Encounter for gynecological examination with Papanicolaou smear of cervix Pap sent Pap in 3 years of normal Physical in 1 year   3. Encounter for initial prescription of injectable contraceptive Discussed options and she wants to try depo Will get first  injection in office today   Meds ordered this encounter  Medications   medroxyPROGESTERone (DEPO-PROVERA) 150 MG/ML injection    Sig: Inject 1 mL (150 mg total) into the muscle every 3 (three) months.    Dispense:  1 mL    Refill:  4    Order Specific Question:   Supervising Provider    Answer:   Despina Hidden, LUTHER H [[id number]]    Take mV with calcium and vitamin D Return in 12 weeks for next depo Use back up birth control for 4 weeks

## 2022-06-19 NOTE — Addendum Note (Signed)
Addended by: Colen Darling on: 06/19/2022 11:22 AM   Modules accepted: Orders

## 2022-06-23 LAB — CYTOLOGY - PAP
Comment: NEGATIVE
Diagnosis: NEGATIVE
High risk HPV: NEGATIVE

## 2022-07-18 IMAGING — US US PELVIS COMPLETE TRANSABD/TRANSVAG W DUPLEX AND/OR DOPPLER
1 series · 13 of 25 positions shown · non-contrast
Comparison: Noncontrast CT on 04/05/2022
COMPARISON: Noncontrast CT on 04/05/2022

Addendum:
CLINICAL DATA: Pelvic pain for 1 day. Enlarged right ovary on prior
CT today.

EXAM:
TRANSABDOMINAL AND TRANSVAGINAL ULTRASOUND OF PELVIS
TECHNIQUE: Both transabdominal and transvaginal ultrasound examinations of the
pelvis were performed. Transabdominal technique was performed for
global imaging of the pelvis including uterus, ovaries, adnexal
regions, and pelvic cul-de-sac. It was necessary to proceed with
endovaginal exam following the transabdominal exam to visualize the
endometrium and ovaries.

[Series 1: us pelvic complete w transvaginal and torsion righ · 124 acquisitions, 13 frames shown]
[im 1/124]
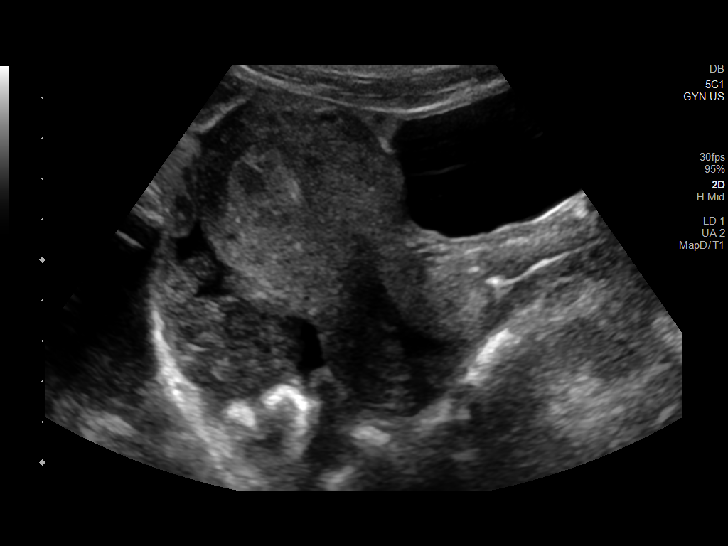
[im 11/124]
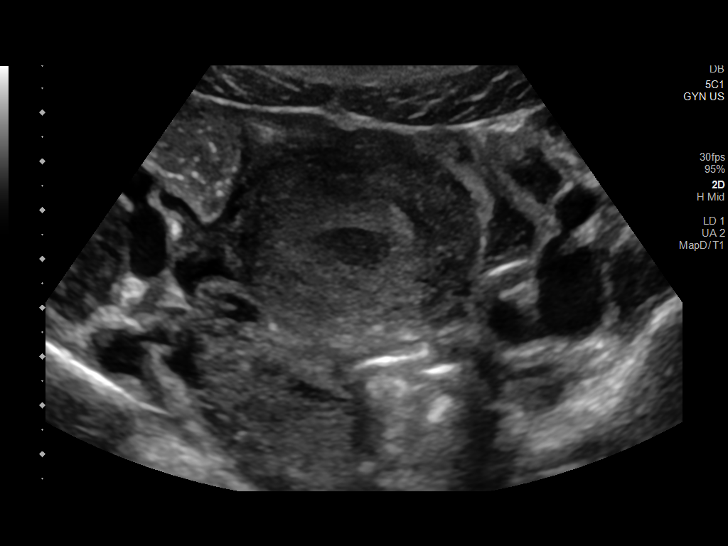
[im 21/124]
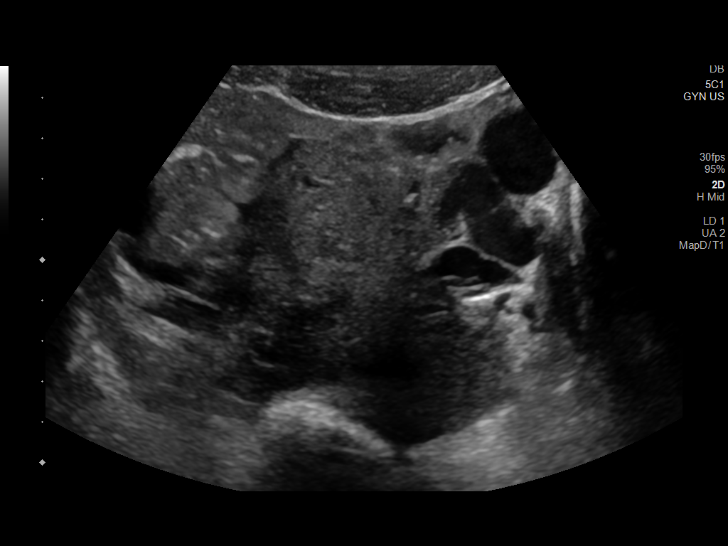
[im 31/124]
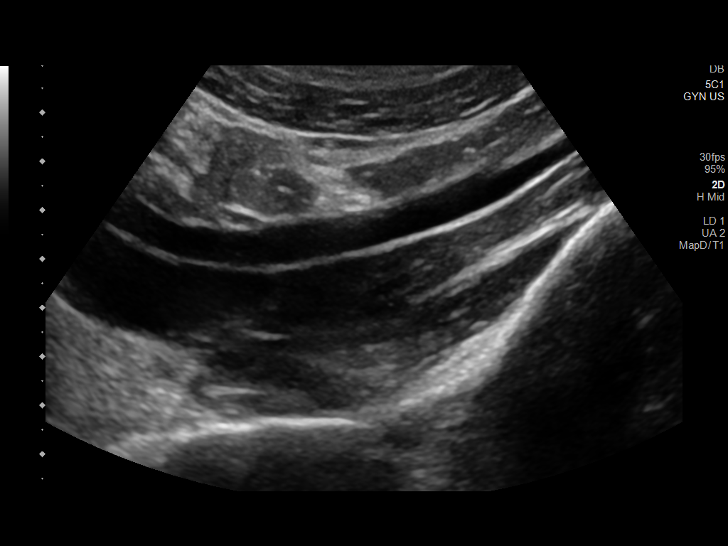
[im 42/124]
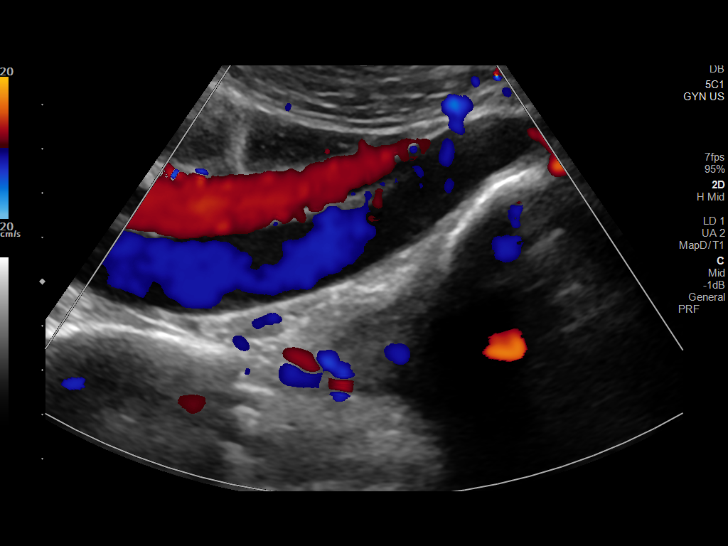
[im 52/124]
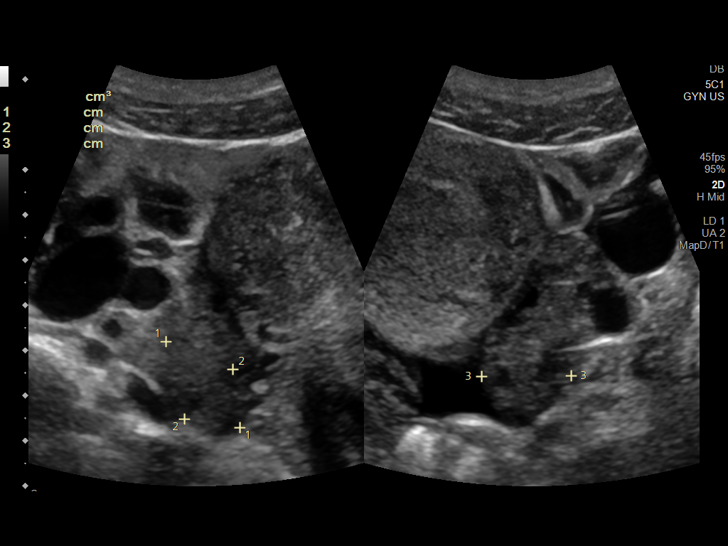
[im 62/124]
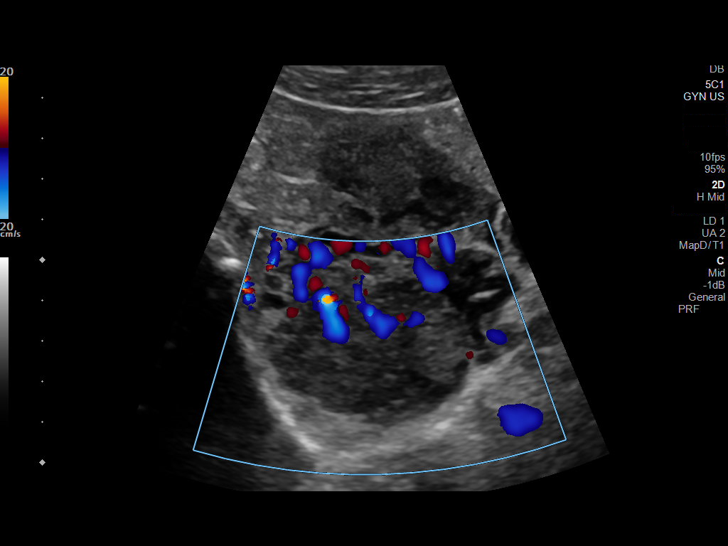
[im 72/124]
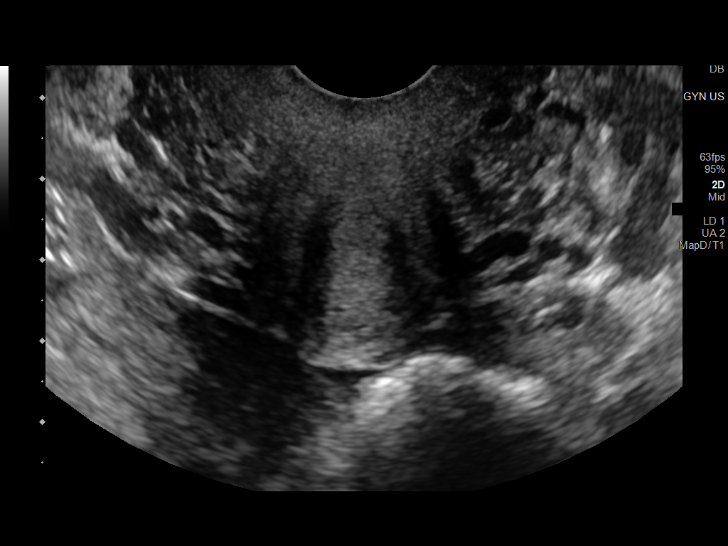
[im 83/124]
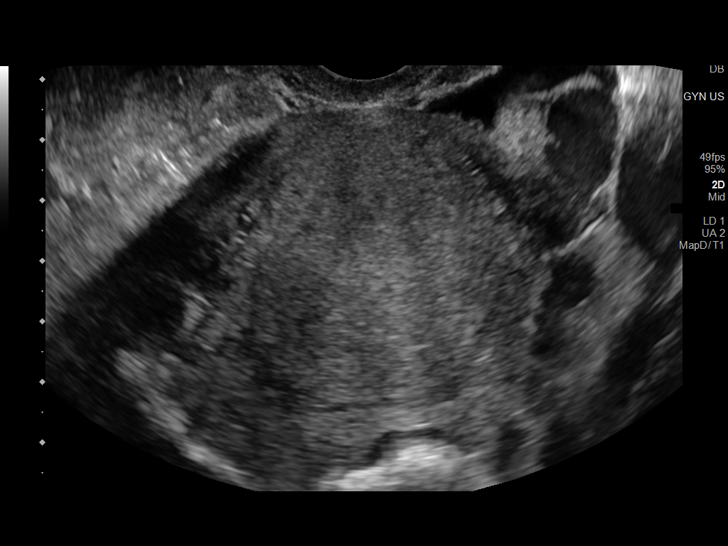
[im 93/124]
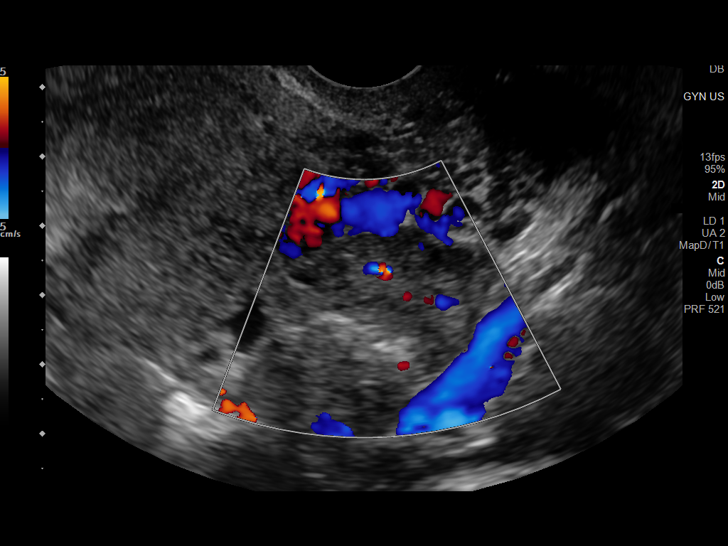
[im 103/124]
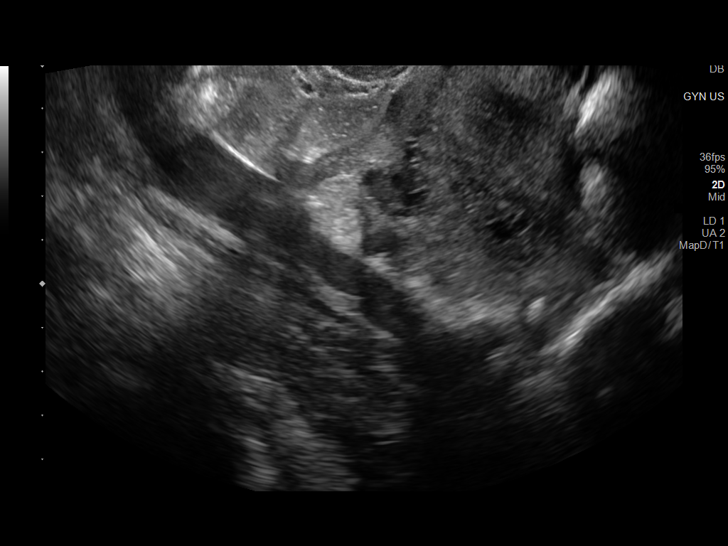
[im 113/124]
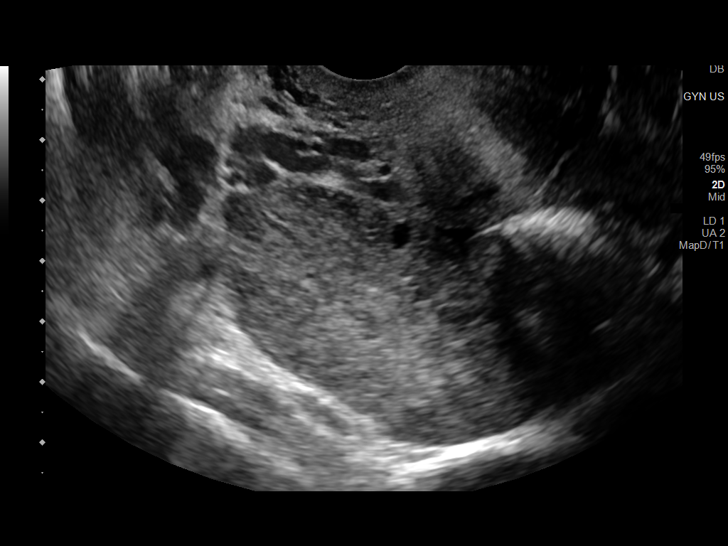
[im 124/124]
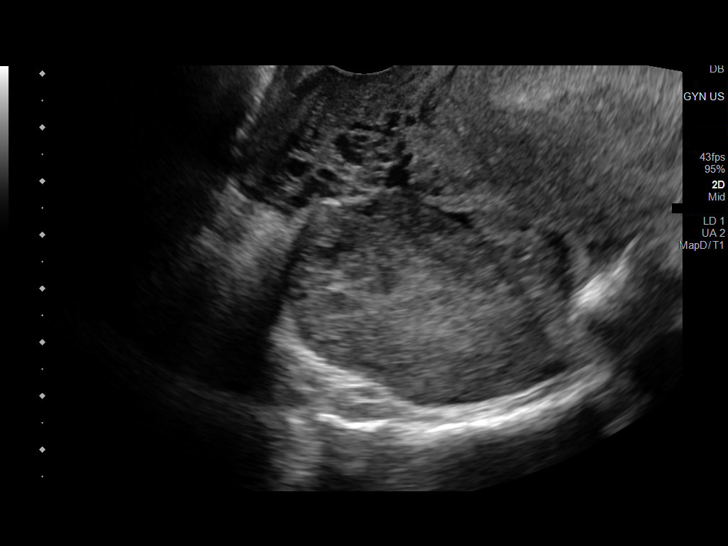

[13 of 25 positions shown; findings below may reference images not displayed]

FINDINGS: Uterus

Measurements: 9.3 x 4.5 x 5.2 cm = volume: 114 mL. No fibroids or
other mass visualized.

Endometrium

Thickness: 22 mm. Trilaminar appearance. No focal abnormality
visualized.

Right ovary

Measurements: 5.1 by 3.6 x 4.5 cm = volume: 43.7 mL. Normal
appearance/no adnexal mass.

Left ovary

Measurements: 3.3 x 2.2 x 2.3 cm = volume: 8.7 mL. Normal
appearance/no adnexal mass.

Other findings

Tiny amount of free fluid noted in pelvic cul-de-sac.
IMPRESSION: Normal appearance of uterus and both ovaries. No evidence of pelvic
mass.

No sonographic evidence for ovarian torsion.

ADDENDUM:
DOPPLER ULTRASOUND OF OVARIES:

Color Doppler ultrasound shows presence internal blood flow within
both ovaries. Duplex Doppler ultrasound shows presence of low
resistance arterial and venous waveforms in both ovaries. There are
no Doppler ultrasound signs of ovarian torsion.

*** End of Addendum ***
FINDINGS: Uterus

Measurements: 9.3 x 4.5 x 5.2 cm = volume: 114 mL. No fibroids or
other mass visualized.

Endometrium

Thickness: 22 mm. Trilaminar appearance. No focal abnormality
visualized.

Right ovary

Measurements: 5.1 by 3.6 x 4.5 cm = volume: 43.7 mL. Normal
appearance/no adnexal mass.

Left ovary

Measurements: 3.3 x 2.2 x 2.3 cm = volume: 8.7 mL. Normal
appearance/no adnexal mass.

Other findings

Tiny amount of free fluid noted in pelvic cul-de-sac.
IMPRESSION: Normal appearance of uterus and both ovaries. No evidence of pelvic
mass.

No sonographic evidence for ovarian torsion.

## 2022-07-18 IMAGING — CT CT ABD-PELV W/ CM
3 of 4 series · 11 of 46 positions shown, 16 images · IV contrast (agent unspecified)
Comparison: April 05, 2022, no contrasted abdominal CT.

CLINICAL DATA: Right lower quadrant abdominal pain.

EXAM:
CT ABDOMEN AND PELVIS WITH CONTRAST
TECHNIQUE: Multidetector CT imaging of the abdomen and pelvis was performed
using the standard protocol following bolus administration of
intravenous contrast.

[Series 3: abdomen 5.0 · axial · 0.72mm/px · z∈[+727,+1057]mm · 7 of 90 slices shown, 12 images]
[im 12/90  soft-tissue]
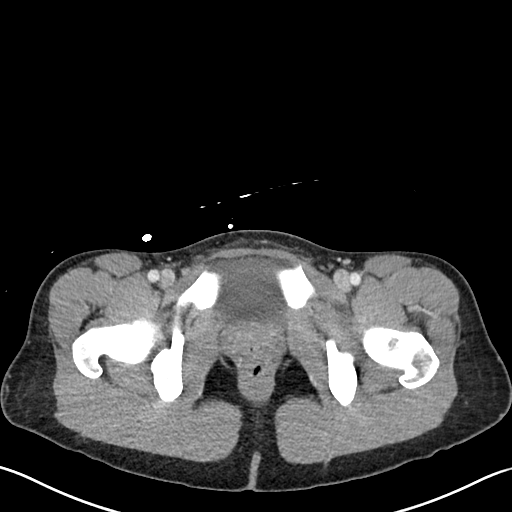
[im 12/90  bone]
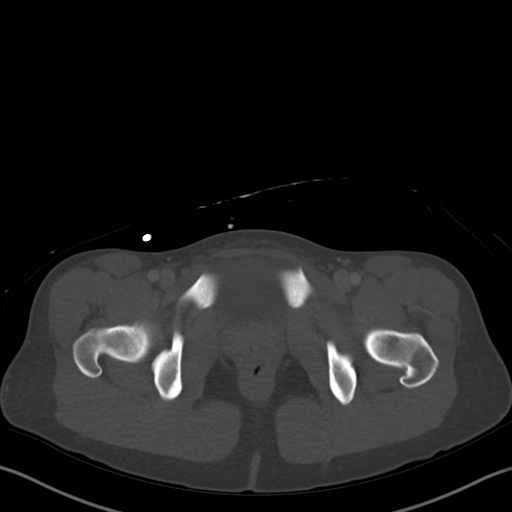
[im 23/90  soft-tissue]
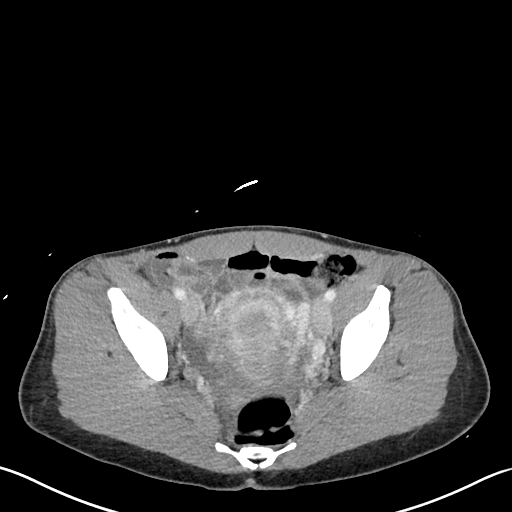
[im 34/90  soft-tissue]
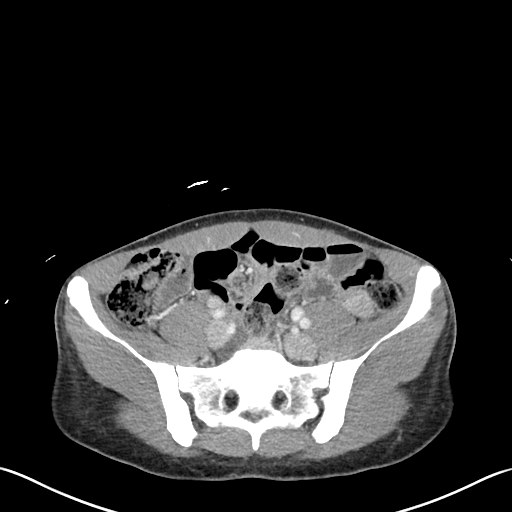
[im 45/90  soft-tissue]
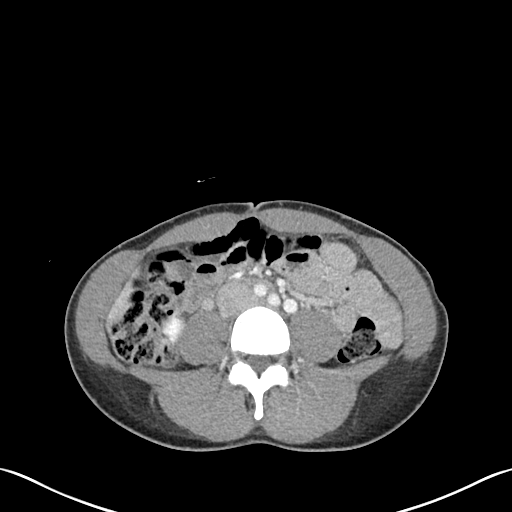
[im 45/90  lung]
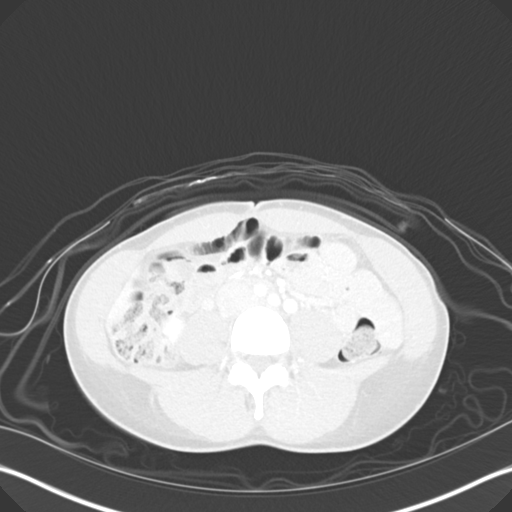
[im 56/90  soft-tissue]
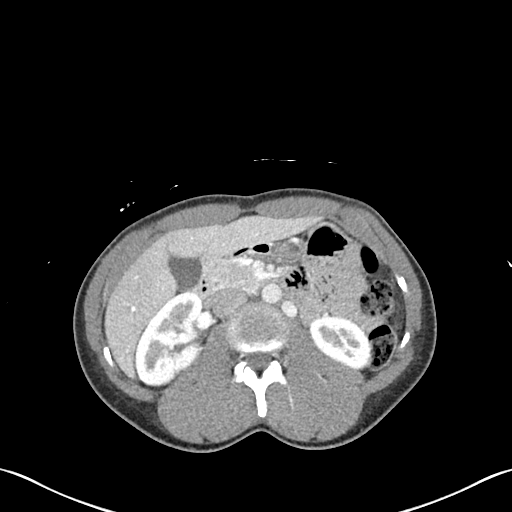
[im 56/90  lung]
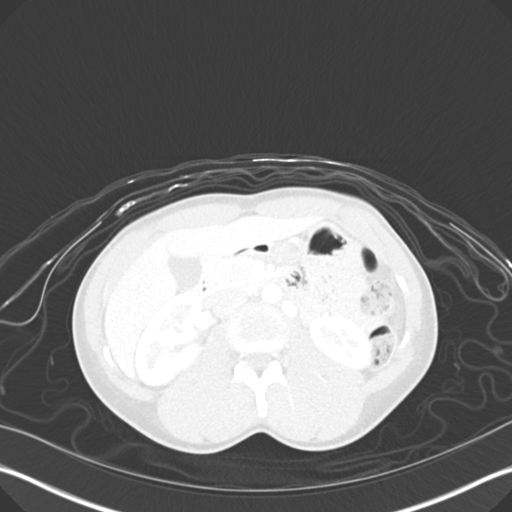
[im 67/90  soft-tissue]
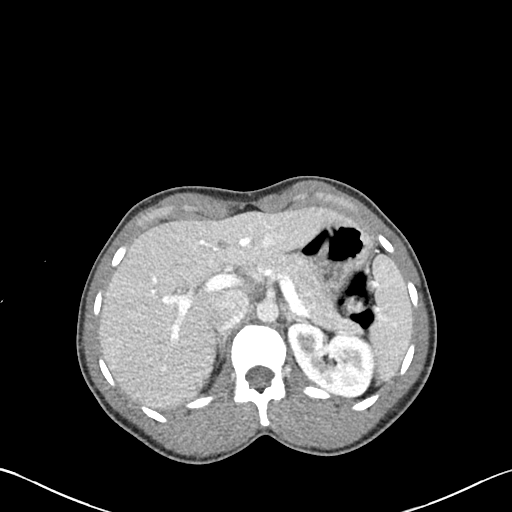
[im 67/90  lung]
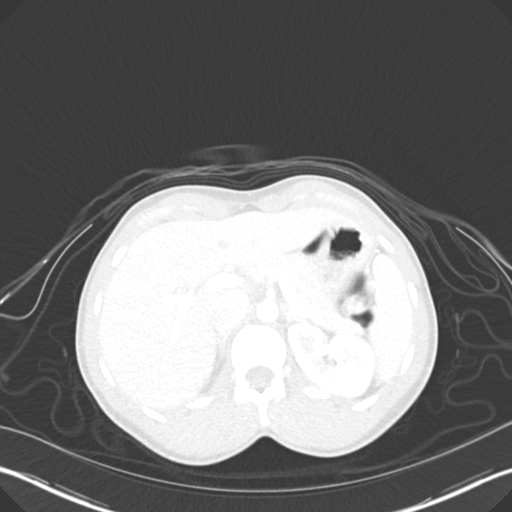
[im 78/90  soft-tissue]
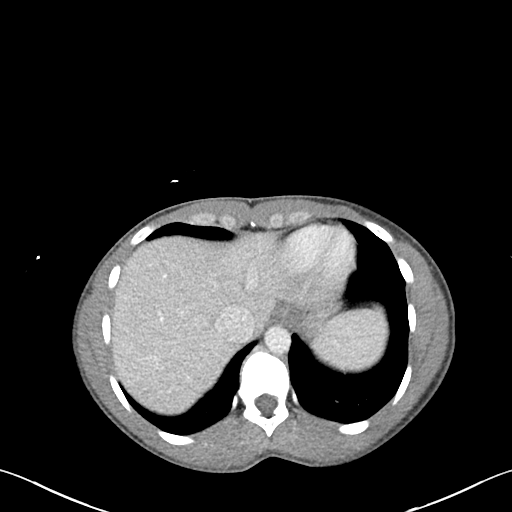
[im 78/90  lung]
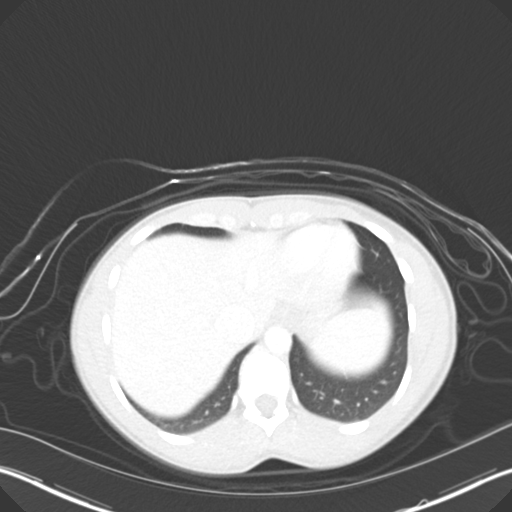

[Series 6: abdomen 3.0 mpr cor · coronal · 0.62mm/px · 3 of 78 slices shown]
[im 26/78  soft-tissue]
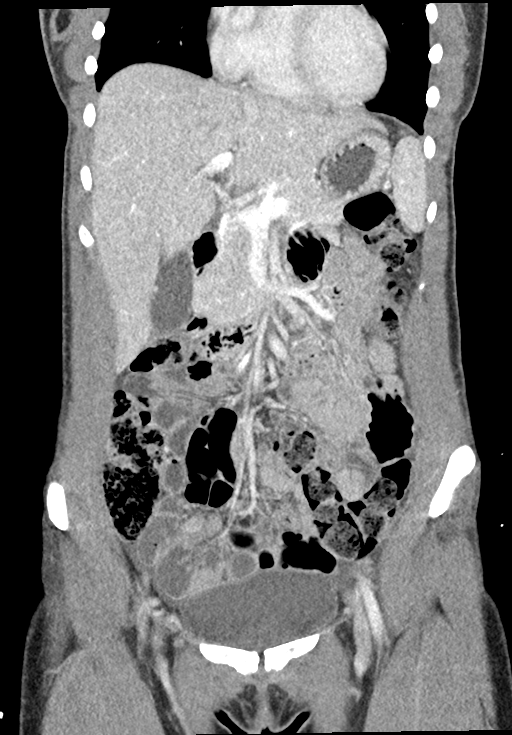
[im 35/78  soft-tissue]
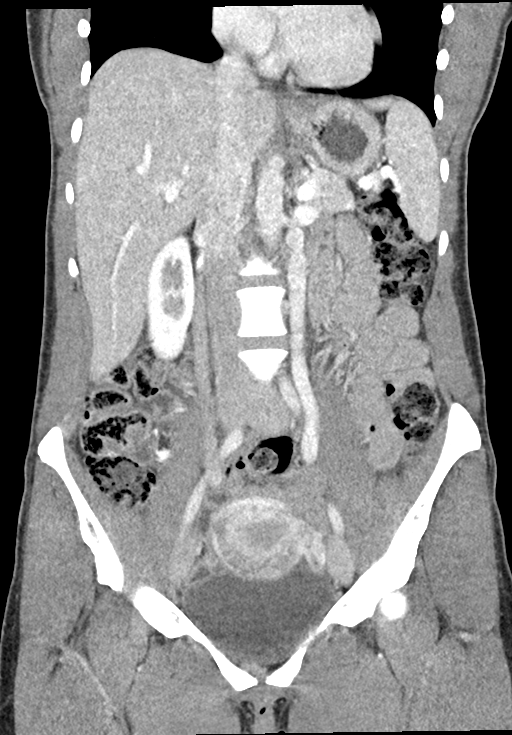
[im 43/78  soft-tissue]
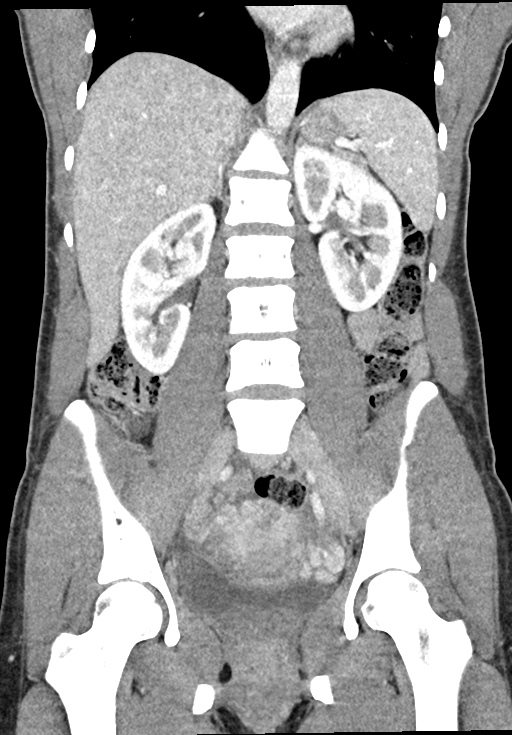

[Series 7: abdomen 3.0 mpr sag · sagittal · 0.45mm/px · 1 of 106 slices shown]
[im 36/106  soft-tissue]
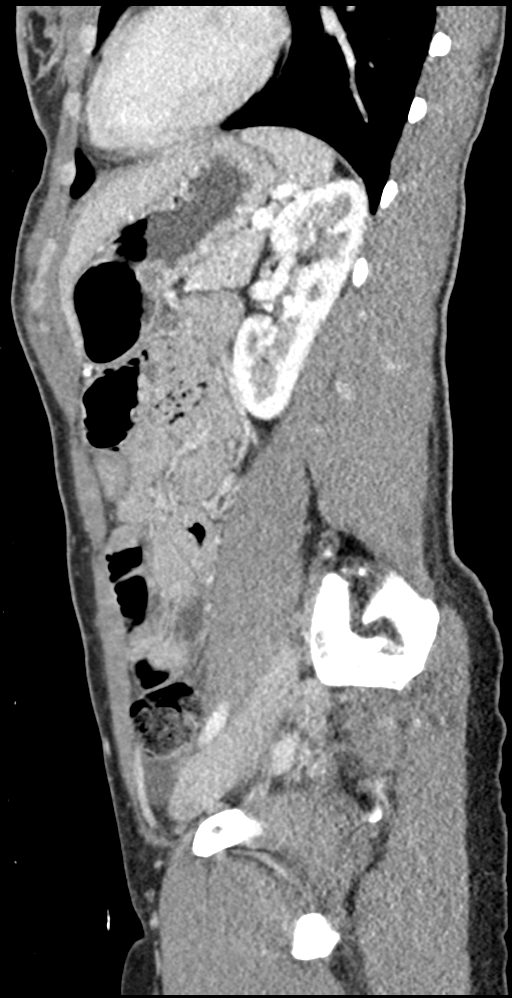

[11 of 46 positions shown; findings below may reference images not displayed]

RADIATION DOSE REDUCTION: This exam was performed according to the
departmental dose-optimization program which includes automated
exposure control, adjustment of the mA and/or kV according to
patient size and/or use of iterative reconstruction technique.

CONTRAST:  100mL OMNIPAQUE IOHEXOL 300 MG/ML  SOLN
FINDINGS: Lower chest: No acute abnormality.

Hepatobiliary: No focal liver abnormality is seen. No gallstones,
gallbladder wall thickening, or biliary dilatation.

Pancreas: Unremarkable. No pancreatic ductal dilatation or
surrounding inflammatory changes.

Spleen: Normal in size without focal abnormality.

Adrenals/Urinary Tract: Adrenal glands are unremarkable. Kidneys are
normal, without renal calculi, focal lesion, or hydronephrosis.
Bladder is unremarkable.

Stomach/Bowel: Stomach is within normal limits. The appendix is not
identified with certainty, however there are no secondary signs of
appendicitis. No evidence of bowel wall thickening, distention, or
inflammatory changes.

Vascular/Lymphatic: No significant vascular findings are present. No
enlarged abdominal or pelvic lymph nodes.

Reproductive: Again seen is an area hypoattenuation in the right
adnexa measuring approximately 4.8 x 2.7 cm.

Other: No abdominal wall hernia or abnormality. No abdominopelvic
ascites.

Musculoskeletal: No acute or significant osseous findings.
IMPRESSION: 1. Nonvisualization of the appendix, however there are no secondary
signs of appendicitis.
2. Again seen is an area hypoattenuation in the right adnexa
measuring approximately 4.8 x 2.7 cm. This may represent prominent
right ovary or right fallopian tube dilation. Given negative pelvic
ultrasound adnexal mass is less likely.

## 2022-09-11 ENCOUNTER — Ambulatory Visit (INDEPENDENT_AMBULATORY_CARE_PROVIDER_SITE_OTHER): Payer: BC Managed Care – PPO | Admitting: *Deleted

## 2022-09-11 DIAGNOSIS — Z3042 Encounter for surveillance of injectable contraceptive: Secondary | ICD-10-CM | POA: Diagnosis not present

## 2022-09-11 MED ORDER — MEDROXYPROGESTERONE ACETATE 150 MG/ML IM SUSP
150.0000 mg | Freq: Once | INTRAMUSCULAR | Status: AC
  Administered 2022-09-11: 150 mg via INTRAMUSCULAR

## 2022-09-11 NOTE — Progress Notes (Signed)
   NURSE VISIT- INJECTION  SUBJECTIVE:  Alicia Wheeler is a 36 y.o. G17P2002 female here for a Depo Provera for contraception/period management. She is a GYN patient.   OBJECTIVE:  There were no vitals taken for this visit.  Appears well, in no apparent distress  Injection administered in: Right deltoid  Meds ordered this encounter  Medications   medroxyPROGESTERone (DEPO-PROVERA) injection 150 mg    ASSESSMENT: GYN patient Depo Provera for contraception/period management PLAN: Follow-up: in 11-13 weeks for next Depo   Levy Pupa  09/11/2022 2:22 PM

## 2022-09-12 ENCOUNTER — Ambulatory Visit: Payer: BC Managed Care – PPO

## 2022-12-03 ENCOUNTER — Ambulatory Visit (INDEPENDENT_AMBULATORY_CARE_PROVIDER_SITE_OTHER): Payer: BC Managed Care – PPO

## 2022-12-03 DIAGNOSIS — Z3042 Encounter for surveillance of injectable contraceptive: Secondary | ICD-10-CM | POA: Diagnosis not present

## 2022-12-03 MED ORDER — MEDROXYPROGESTERONE ACETATE 150 MG/ML IM SUSP
150.0000 mg | Freq: Once | INTRAMUSCULAR | Status: AC
  Administered 2022-12-03: 150 mg via INTRAMUSCULAR

## 2022-12-03 NOTE — Progress Notes (Signed)
   NURSE VISIT- INJECTION  SUBJECTIVE:  Alicia Wheeler is a 36 y.o. G16P2002 female here for a Depo Provera for contraception/period management. She is a GYN patient.   OBJECTIVE:  There were no vitals taken for this visit.  Appears well, in no apparent distress  Injection administered in: Left deltoid  Meds ordered this encounter  Medications   medroxyPROGESTERone (DEPO-PROVERA) injection 150 mg    ASSESSMENT: GYN patient Depo Provera for contraception/period management PLAN: Follow-up: in 11-13 weeks for next Depo   Janalyn Shy  12/03/2022 2:26 PM

## 2023-02-25 ENCOUNTER — Ambulatory Visit: Payer: BC Managed Care – PPO

## 2023-03-04 ENCOUNTER — Ambulatory Visit (INDEPENDENT_AMBULATORY_CARE_PROVIDER_SITE_OTHER): Payer: BC Managed Care – PPO | Admitting: *Deleted

## 2023-03-04 DIAGNOSIS — Z3042 Encounter for surveillance of injectable contraceptive: Secondary | ICD-10-CM | POA: Diagnosis not present

## 2023-03-04 MED ORDER — MEDROXYPROGESTERONE ACETATE 150 MG/ML IM SUSP
150.0000 mg | Freq: Once | INTRAMUSCULAR | Status: AC
  Administered 2023-03-04: 150 mg via INTRAMUSCULAR

## 2023-03-04 NOTE — Progress Notes (Signed)
   NURSE VISIT- INJECTION  SUBJECTIVE:  Alicia Wheeler is a 37 y.o. G25P2002 female here for a Depo Provera for contraception/period management. She is a GYN patient.   OBJECTIVE:  There were no vitals taken for this visit.  Appears well, in no apparent distress  Injection administered in: Right deltoid  Meds ordered this encounter  Medications   medroxyPROGESTERone (DEPO-PROVERA) injection 150 mg    ASSESSMENT: GYN patient Depo Provera for contraception/period management PLAN: Follow-up: in 11-13 weeks for next Depo   Alicia Wheeler  03/04/2023 3:33 PM

## 2023-05-27 ENCOUNTER — Ambulatory Visit (INDEPENDENT_AMBULATORY_CARE_PROVIDER_SITE_OTHER): Payer: BC Managed Care – PPO | Admitting: *Deleted

## 2023-05-27 DIAGNOSIS — Z3042 Encounter for surveillance of injectable contraceptive: Secondary | ICD-10-CM

## 2023-05-27 MED ORDER — MEDROXYPROGESTERONE ACETATE 150 MG/ML IM SUSY
150.0000 mg | PREFILLED_SYRINGE | Freq: Once | INTRAMUSCULAR | Status: AC
  Administered 2023-05-27: 150 mg via INTRAMUSCULAR

## 2023-05-27 NOTE — Progress Notes (Signed)
   NURSE VISIT- INJECTION  SUBJECTIVE:  Alicia Wheeler is a 37 y.o. G32P2002 female here for a Depo Provera for contraception/period management. She is a GYN patient.   OBJECTIVE:  There were no vitals taken for this visit.  Appears well, in no apparent distress  Injection administered in: Left deltoid  Meds ordered this encounter  Medications   medroxyPROGESTERone Acetate SUSY 150 mg    ASSESSMENT: GYN patient Depo Provera for contraception/period management PLAN: Follow-up: in 11-13 weeks for next Depo   Malachy Mood  05/27/2023 9:36 AM

## 2023-08-19 ENCOUNTER — Ambulatory Visit: Payer: BC Managed Care – PPO

## 2023-08-19 ENCOUNTER — Other Ambulatory Visit: Payer: Self-pay | Admitting: Adult Health

## 2023-08-25 ENCOUNTER — Ambulatory Visit (INDEPENDENT_AMBULATORY_CARE_PROVIDER_SITE_OTHER): Payer: BC Managed Care – PPO | Admitting: *Deleted

## 2023-08-25 DIAGNOSIS — Z3042 Encounter for surveillance of injectable contraceptive: Secondary | ICD-10-CM

## 2023-08-25 MED ORDER — MEDROXYPROGESTERONE ACETATE 150 MG/ML IM SUSP
150.0000 mg | Freq: Once | INTRAMUSCULAR | Status: AC
  Administered 2023-08-25: 150 mg via INTRAMUSCULAR

## 2023-08-25 NOTE — Progress Notes (Signed)
   NURSE VISIT- INJECTION  SUBJECTIVE:  Alicia Wheeler is a 37 y.o. G56P2002 female here for a Depo Provera for contraception/period management. She is a GYN patient.   OBJECTIVE:  There were no vitals taken for this visit.  Appears well, in no apparent distress  Injection administered in: Right deltoid  Meds ordered this encounter  Medications   medroxyPROGESTERone (DEPO-PROVERA) injection 150 mg    ASSESSMENT: GYN patient Depo Provera for contraception/period management PLAN: Follow-up: in 11-13 weeks for next Depo   Jobe Marker  08/25/2023 3:51 PM

## 2023-11-17 ENCOUNTER — Ambulatory Visit: Payer: BC Managed Care – PPO

## 2023-11-17 DIAGNOSIS — Z3042 Encounter for surveillance of injectable contraceptive: Secondary | ICD-10-CM

## 2023-11-17 MED ORDER — MEDROXYPROGESTERONE ACETATE 150 MG/ML IM SUSY
150.0000 mg | PREFILLED_SYRINGE | Freq: Once | INTRAMUSCULAR | Status: AC
  Administered 2023-11-17: 150 mg via INTRAMUSCULAR

## 2023-11-17 NOTE — Progress Notes (Signed)
   NURSE VISIT- INJECTION  SUBJECTIVE:  Alicia Wheeler is a 37 y.o. G28P2002 female here for a Depo Provera for contraception/period management. She is a GYN patient.   OBJECTIVE:  There were no vitals taken for this visit.  Appears well, in no apparent distress  Injection administered in: Left deltoid  Meds ordered this encounter  Medications   medroxyPROGESTERone Acetate SUSY 150 mg    ASSESSMENT: GYN patient Depo Provera for contraception/period management PLAN: Follow-up: in 11-13 weeks for next Depo   Caralyn Guile  11/17/2023 3:48 PM

## 2024-01-15 ENCOUNTER — Ambulatory Visit: Payer: 59 | Admitting: Adult Health

## 2024-01-15 ENCOUNTER — Encounter: Payer: Self-pay | Admitting: Adult Health

## 2024-01-15 VITALS — BP 113/78 | HR 98 | Ht 63.0 in | Wt 145.0 lb

## 2024-01-15 DIAGNOSIS — N926 Irregular menstruation, unspecified: Secondary | ICD-10-CM

## 2024-01-15 DIAGNOSIS — Z3202 Encounter for pregnancy test, result negative: Secondary | ICD-10-CM

## 2024-01-15 DIAGNOSIS — N930 Postcoital and contact bleeding: Secondary | ICD-10-CM | POA: Diagnosis not present

## 2024-01-15 LAB — POCT URINE PREGNANCY: Preg Test, Ur: NEGATIVE

## 2024-01-15 MED ORDER — METRONIDAZOLE 0.75 % VA GEL: 1.0000 | Freq: Every day | VAGINAL | 0 refills | Status: DC

## 2024-01-15 NOTE — Progress Notes (Signed)
  Subjective:     Patient ID: Alicia Wheeler, female   DOB: [birth year], 38 y.o.   MRN: [id number]  HPI Alicia Wheeler is a 38 year old white female, separated, G2P2002 in complaining of irregular bleeding on depo, has bleeding after sex, and when lifting weights.      Component Value Date/Time   DIAGPAP  06/19/2022 1038    - Negative for intraepithelial lesion or malignancy (NILM)   HPVHIGH Negative 06/19/2022 1038   ADEQPAP  06/19/2022 1038    Satisfactory for evaluation; transformation zone component PRESENT.    PCP is Lilyan Punt MD  Review of Systems +irregular bleeding on depo, has bleeding after sex, and when lifting weights.  Reviewed past medical,surgical, social and family history. Reviewed medications and allergies.     Objective:   Physical Exam BP 113/78 (BP Location: Left Arm, Patient Position: Sitting, Cuff Size: Normal)   Pulse 98   Ht 5\' 3"  (1.6 m)   Wt 145 lb (65.8 kg)   BMI 25.69 kg/m  UPT is negative Skin warm and dry.Pelvic: external genitalia is normal in appearance no lesions, vagina: pink, scant tan discharge,no odor,urethra has no lesions or masses noted, cervix has eversion posterior edge,irritated,  uterus: normal size, shape and contour, non tender, no masses felt, adnexa: no masses or tenderness noted. Bladder is non tender and no masses felt.   Fall risk is low  Upstream - 01/15/24 0908       Pregnancy Intention Screening   Does the patient want to become pregnant in the next year? No    Does the patient's partner want to become pregnant in the next year? No    Would the patient like to discuss contraceptive options today? No      Contraception Wrap Up   Current Method Hormonal Injection    End Method Hormonal Injection    Contraception Counseling Provided Yes            Examination chaperoned by Malachy Mood LPN  Assessment:     1. Pregnancy examination or test, negative result - POCT urine pregnancy  2. Irregular bleeding  (Primary) +irregular bleeding on depo has spotted most of January   3. Postcoital bleeding Bleeding after sex, was heavy Monday  Cervix is irritated No sex while using metrogel    Will try metrogel  Meds ordered this encounter  Medications   metroNIDAZOLE (METROGEL) 0.75 % vaginal gel    Sig: Place 1 Applicatorful vaginally at bedtime.    Dispense:  70 g    Refill:  0    Supervising Provider:   Lazaro Arms [[id number]]    Plan:     Follow up in 1 week for exam

## 2024-01-22 ENCOUNTER — Encounter: Payer: Self-pay | Admitting: Adult Health

## 2024-01-22 ENCOUNTER — Ambulatory Visit: Payer: 59 | Admitting: Adult Health

## 2024-01-22 VITALS — BP 122/70 | HR 79 | Ht 63.0 in | Wt 145.0 lb

## 2024-01-22 DIAGNOSIS — N926 Irregular menstruation, unspecified: Secondary | ICD-10-CM

## 2024-01-22 MED ORDER — METRONIDAZOLE 0.75 % VA GEL: 1.0000 | Freq: Every day | VAGINAL | 0 refills | Status: AC

## 2024-01-22 NOTE — Progress Notes (Signed)
  Subjective:     Patient ID: Alicia Wheeler, female   DOB: January 03, 1986, 38 y.o.   MRN: [id number]  HPI Alicia Wheeler  is a 38 year old white female,separated, G2P2002, back in follow up on using metrogel  for bleeding when lifting weights and after sex. Has not had sex but has been lifting weights, and bleeding has stopped when lifting.     Component Value Date/Time   DIAGPAP  06/19/2022 1038    - Negative for intraepithelial lesion or malignancy (NILM)   HPVHIGH Negative 06/19/2022 1038   ADEQPAP  06/19/2022 1038    Satisfactory for evaluation; transformation zone component PRESENT.    PCP is Dr Alphonsa  Review of Systems Bleeding has stopped when lifting weights Reviewed past medical,surgical, social and family history. Reviewed medications and allergies.     Objective:   Physical Exam BP 122/70 (BP Location: Left Arm, Patient Position: Sitting, Cuff Size: Normal)   Pulse 79   Ht 5' 3 (1.6 m)   Wt 145 lb (65.8 kg)   BMI 25.69 kg/m     Skin warm and dry.Pelvic: external genitalia is normal in appearance no lesions, vagina: pink,urethra has no lesions or masses noted, cervix is not irritated now, uterus: normal size, shape and contour, non tender, no masses felt, adnexa: no masses or tenderness noted. Bladder is non tender and no masses felt.  Fall risk is low  Upstream - 01/22/24 0854       Pregnancy Intention Screening   Does the patient want to become pregnant in the next year? No    Does the patient's partner want to become pregnant in the next year? No    Would the patient like to discuss contraceptive options today? No      Contraception Wrap Up   Current Method Hormonal Injection    End Method Hormonal Injection    Contraception Counseling Provided Yes            Examination chaperoned by Clarita Salt LPN  Assessment:     Irregular bleeding Bleeding has stopped when lifting weights, has not had sex yet Has some metrogel  at home, will refill if bleeding happens  again after lifting or with sex     Plan:     Follow up prn

## 2024-02-09 ENCOUNTER — Ambulatory Visit: Payer: 59 | Admitting: *Deleted

## 2024-02-09 DIAGNOSIS — Z3042 Encounter for surveillance of injectable contraceptive: Secondary | ICD-10-CM

## 2024-02-09 MED ORDER — MEDROXYPROGESTERONE ACETATE 150 MG/ML IM SUSP
150.0000 mg | Freq: Once | INTRAMUSCULAR | Status: AC
  Administered 2024-02-09: 150 mg via INTRAMUSCULAR

## 2024-02-09 NOTE — Progress Notes (Signed)
   NURSE VISIT- INJECTION  SUBJECTIVE:  Alicia Wheeler is a 38 y.o. G27P2002 female here for a Depo Provera for contraception/period management. She is a GYN patient.   OBJECTIVE:  There were no vitals taken for this visit.  Appears well, in no apparent distress  Injection administered in: Right deltoid  Meds ordered this encounter  Medications   medroxyPROGESTERone (DEPO-PROVERA) injection 150 mg    ASSESSMENT: GYN patient Depo Provera for contraception/period management PLAN: Follow-up: in 11-13 weeks for next Depo   Annamarie Dawley  02/09/2024 4:01 PM

## 2024-05-03 ENCOUNTER — Ambulatory Visit: Payer: 59

## 2024-05-03 DIAGNOSIS — Z3042 Encounter for surveillance of injectable contraceptive: Secondary | ICD-10-CM

## 2024-05-03 MED ORDER — MEDROXYPROGESTERONE ACETATE 150 MG/ML IM SUSP
150.0000 mg | Freq: Once | INTRAMUSCULAR | Status: AC
  Administered 2024-05-03: 150 mg via INTRAMUSCULAR

## 2024-05-03 NOTE — Progress Notes (Signed)
   NURSE VISIT- INJECTION  SUBJECTIVE:  Alicia Wheeler is a 38 y.o. G69P2002 female here for a Depo Provera  for contraception/period management. She is a GYN patient.   OBJECTIVE:  There were no vitals taken for this visit.  Appears well, in no apparent distress  Injection administered in: Left deltoid  Meds ordered this encounter  Medications   medroxyPROGESTERone  (DEPO-PROVERA ) injection 150 mg    ASSESSMENT: GYN patient Depo Provera  for contraception/period management PLAN: Follow-up: in 11-13 weeks for next Depo   Alyssa Jumper  05/03/2024 4:45 PM

## 2024-08-01 ENCOUNTER — Ambulatory Visit (INDEPENDENT_AMBULATORY_CARE_PROVIDER_SITE_OTHER): Admitting: *Deleted

## 2024-08-01 DIAGNOSIS — Z3042 Encounter for surveillance of injectable contraceptive: Secondary | ICD-10-CM

## 2024-08-01 MED ORDER — MEDROXYPROGESTERONE ACETATE 150 MG/ML IM SUSP
150.0000 mg | Freq: Once | INTRAMUSCULAR | Status: AC
  Administered 2024-08-01: 150 mg via INTRAMUSCULAR

## 2024-08-01 NOTE — Progress Notes (Signed)
   NURSE VISIT- INJECTION  SUBJECTIVE:  Alicia Wheeler is a 38 y.o. G80P2002 female here for a Depo Provera  for contraception/period management. She is a GYN patient.   OBJECTIVE:  There were no vitals taken for this visit.  Appears well, in no apparent distress  Injection administered in: Right deltoid  Meds ordered this encounter  Medications   medroxyPROGESTERone  (DEPO-PROVERA ) injection 150 mg    ASSESSMENT: GYN patient Depo Provera  for contraception/period management PLAN: Follow-up: in 11-13 weeks for next Depo   Rutherford Rover  08/01/2024 4:29 PM

## 2024-10-22 ENCOUNTER — Other Ambulatory Visit: Payer: Self-pay | Admitting: Adult Health

## 2024-10-24 ENCOUNTER — Telehealth: Payer: Self-pay | Admitting: Adult Health

## 2024-10-24 ENCOUNTER — Ambulatory Visit

## 2024-10-24 MED ORDER — MEDROXYPROGESTERONE ACETATE 150 MG/ML IM SUSP: 150.0000 mg | INTRAMUSCULAR | 4 refills | Status: AC

## 2024-10-24 NOTE — Addendum Note (Signed)
 Addended by: Laresha Bacorn A on: 10/24/2024 04:49 PM   Modules accepted: Orders

## 2024-10-24 NOTE — Telephone Encounter (Signed)
 Pt is requesting a refill on Depo. Thanks! JSY

## 2024-10-24 NOTE — Telephone Encounter (Signed)
Refilled depo 

## 2024-10-24 NOTE — Telephone Encounter (Signed)
 Patient calling asking for depo refill sent to [facility] [address]

## 2024-10-26 ENCOUNTER — Ambulatory Visit (INDEPENDENT_AMBULATORY_CARE_PROVIDER_SITE_OTHER): Admitting: *Deleted

## 2024-10-26 DIAGNOSIS — Z3042 Encounter for surveillance of injectable contraceptive: Secondary | ICD-10-CM

## 2024-10-26 MED ORDER — MEDROXYPROGESTERONE ACETATE 150 MG/ML IM SUSP
150.0000 mg | Freq: Once | INTRAMUSCULAR | Status: AC
Start: 2024-10-26 — End: 2024-10-26
  Administered 2024-10-26: 150 mg via INTRAMUSCULAR

## 2024-10-26 NOTE — Progress Notes (Signed)
   NURSE VISIT- INJECTION  SUBJECTIVE:  Alicia Wheeler is a 38 y.o. G78P2002 female here for a Depo Provera  for contraception/period management. She is a GYN patient.   OBJECTIVE:  There were no vitals taken for this visit.  Appears well, in no apparent distress  Injection administered in: Left deltoid  Meds ordered this encounter  Medications   medroxyPROGESTERone  (DEPO-PROVERA ) injection 150 mg    ASSESSMENT: GYN patient Depo Provera  for contraception/period management PLAN: Follow-up: in 11-13 weeks for next Depo   Alan LITTIE Fischer  10/26/2024 3:51 PM

## 2025-01-17 ENCOUNTER — Ambulatory Visit

## 2025-01-24 ENCOUNTER — Ambulatory Visit
